# Patient Record
Sex: Female | Born: 1980 | Race: Black or African American | Hispanic: No | Smoking: Never smoker
Health system: Southern US, Community
[De-identification: ages and names within clinical notes are randomized; demographics above are authoritative.]

## PROBLEM LIST (undated history)

## (undated) DIAGNOSIS — D649 Anemia, unspecified: Secondary | ICD-10-CM

## (undated) DIAGNOSIS — D496 Neoplasm of unspecified behavior of brain: Secondary | ICD-10-CM

## (undated) HISTORY — PX: BRAIN SURGERY: SHX531

---

## 2016-05-16 ENCOUNTER — Ambulatory Visit (INDEPENDENT_AMBULATORY_CARE_PROVIDER_SITE_OTHER): Payer: 59 | Admitting: Urgent Care

## 2016-05-16 ENCOUNTER — Ambulatory Visit (INDEPENDENT_AMBULATORY_CARE_PROVIDER_SITE_OTHER): Payer: 59

## 2016-05-16 ENCOUNTER — Ambulatory Visit: Payer: 59

## 2016-05-16 VITALS — BP 118/70 | HR 74 | Temp 98.6°F | Resp 18 | Ht 66.0 in | Wt 164.8 lb

## 2016-05-16 DIAGNOSIS — R05 Cough: Secondary | ICD-10-CM

## 2016-05-16 DIAGNOSIS — R058 Other specified cough: Secondary | ICD-10-CM

## 2016-05-16 DIAGNOSIS — J029 Acute pharyngitis, unspecified: Secondary | ICD-10-CM

## 2016-05-16 MED ORDER — ACETAMINOPHEN-CODEINE 120-12 MG/5ML PO SOLN: 10.0000 mL | Freq: Three times a day (TID) | ORAL | 0 refills | Status: DC | PRN

## 2016-05-16 MED ORDER — BENZONATATE 100 MG PO CAPS: 100.0000 mg | ORAL_CAPSULE | Freq: Three times a day (TID) | ORAL | 0 refills | Status: DC | PRN

## 2016-05-16 MED ORDER — PREDNISONE 20 MG PO TABS: ORAL_TABLET | ORAL | 0 refills | Status: DC

## 2016-05-16 MED ORDER — AZITHROMYCIN 250 MG PO TABS: ORAL_TABLET | ORAL | 0 refills | Status: DC

## 2016-05-16 NOTE — Addendum Note (Signed)
Addended by: Jaynee Eagles on: 05/16/2016 04:48 PM   Modules accepted: Orders

## 2016-05-16 NOTE — Progress Notes (Signed)
  MRN: [id number] DOB: 05-03-1980  Subjective:   Melissa Cisneros is a 36 y.o. female presenting for chief complaint of Cough  Reports 3 week history of persistent productive cough, fever (highest was 104F), runny nose, sore throat worsened by her cough, fatigue. Has taken Cepacol, otc cough medications. Uses Flonase for her allergies. She has had multiple sick contacts at work. Denies chest pain, shob, n/v, abdominal pain. Denies smoking cigarettes. She updated her flu shot this past season as required by her employer.  Dorota is not currently taking any medications. Also has No Known Allergies. Lilian denies past medical and surgical history.  Objective:   Vitals: BP 118/70 (BP Location: Right Arm, Patient Position: Sitting, Cuff Size: Small)   Pulse 74   Temp 98.6 F (37 C) (Oral)   Resp 18   Ht 5\' 6"  (1.676 m)   Wt 164 lb 12.8 oz (74.8 kg)   LMP 05/16/2016 (Exact Date)   SpO2 100%   BMI 26.60 kg/m   Physical Exam  Constitutional: She is oriented to person, place, and time. She appears well-developed and well-nourished.  HENT:  Mouth/Throat: Oropharynx is clear and moist.  Eyes: No scleral icterus.  Neck: Normal range of motion. Neck supple.  Cardiovascular: Normal rate, regular rhythm and intact distal pulses.  Exam reveals no gallop and no friction rub.   No murmur heard. Pulmonary/Chest: No respiratory distress. She has no wheezes. She has no rales.  Lymphadenopathy:    She has no cervical adenopathy.  Neurological: She is alert and oriented to person, place, and time.  Skin: Skin is warm and dry.   Dg Chest 2 View  Result Date: 05/16/2016 CLINICAL DATA:  Cough EXAM: CHEST  2 VIEW COMPARISON:  None. FINDINGS: Lungs are clear. Heart size and pulmonary vascularity are normal. No adenopathy. No bone lesions. Visualized loops of bowel in the upper abdomen appear mildly dilated. IMPRESSION: No edema or consolidation.  Question a degree of bowel ileus. Electronically Signed   By: Lowella Grip III M.D.   On: 05/16/2016 13:40   Assessment and Plan :   1. Productive cough 2. Sore throat - Will cover for infectious process given duration of persistent cough. Start Azithromycin, steroid course, use cough suppression therapy. Maintain allergy medications. RTC if no improvement in symptoms.   Jaynee Eagles, PA-C [facility] [phone] 05/16/2016  1:11 PM

## 2016-05-16 NOTE — Patient Instructions (Addendum)
Cough, Adult Coughing is a reflex that clears your throat and your airways. Coughing helps to heal and protect your lungs. It is normal to cough occasionally, but a cough that happens with other symptoms or lasts a long time may be a sign of a condition that needs treatment. A cough may last only 2-3 weeks (acute), or it may last longer than 8 weeks (chronic). What are the causes? Coughing is commonly caused by:  Breathing in substances that irritate your lungs.  A viral or bacterial respiratory infection.  Allergies.  Asthma.  Postnasal drip.  Smoking.  Acid backing up from the stomach into the esophagus (gastroesophageal reflux).  Certain medicines.  Chronic lung problems, including COPD (or rarely, lung cancer).  Other medical conditions such as heart failure. Follow these instructions at home: Pay attention to any changes in your symptoms. Take these actions to help with your discomfort:  Take medicines only as told by your health care provider.  If you were prescribed an antibiotic medicine, take it as told by your health care provider. Do not stop taking the antibiotic even if you start to feel better.  Talk with your health care provider before you take a cough suppressant medicine.  Drink enough fluid to keep your urine clear or pale yellow.  If the air is dry, use a cold steam vaporizer or humidifier in your bedroom or your home to help loosen secretions.  Avoid anything that causes you to cough at work or at home.  If your cough is worse at night, try sleeping in a semi-upright position.  Avoid cigarette smoke. If you smoke, quit smoking. If you need help quitting, ask your health care provider.  Avoid caffeine.  Avoid alcohol.  Rest as needed. Contact a health care provider if:  You have new symptoms.  You cough up pus.  Your cough does not get better after 2-3 weeks, or your cough gets worse.  You cannot control your cough with suppressant medicines  and you are losing sleep.  You develop pain that is getting worse or pain that is not controlled with pain medicines.  You have a fever.  You have unexplained weight loss.  You have night sweats. Get help right away if:  You cough up blood.  You have difficulty breathing.  Your heartbeat is very fast. This information is not intended to replace advice given to you by your health care provider. Make sure you discuss any questions you have with your health care provider. Document Released: 08/30/2010 Document Revised: 08/09/2015 Document Reviewed: 05/10/2014 Elsevier Interactive Patient Education  2017 Elsevier Inc.     IF you received an x-ray today, you will receive an invoice from [facility]. Please contact [facility] at [phone] with questions or concerns regarding your invoice.   IF you received labwork today, you will receive an invoice from [facility]. Please contact [facility] at [phone] with questions or concerns regarding your invoice.   Our billing staff will not be able to assist you with questions regarding bills from these companies.  You will be contacted with the lab results as soon as they are available. The fastest way to get your results is to activate your My Chart account. Instructions are located on the last page of this paperwork. If you have not heard from us regarding the results in 2 weeks, please contact this office.      

## 2016-07-28 DIAGNOSIS — R87612 Low grade squamous intraepithelial lesion on cytologic smear of cervix (LGSIL): Secondary | ICD-10-CM | POA: Diagnosis not present

## 2016-07-28 DIAGNOSIS — Z113 Encounter for screening for infections with a predominantly sexual mode of transmission: Secondary | ICD-10-CM | POA: Diagnosis not present

## 2016-07-28 DIAGNOSIS — Z130 Encounter for screening for diseases of the blood and blood-forming organs and certain disorders involving the immune mechanism: Secondary | ICD-10-CM | POA: Diagnosis not present

## 2016-07-28 DIAGNOSIS — Z124 Encounter for screening for malignant neoplasm of cervix: Secondary | ICD-10-CM | POA: Diagnosis not present

## 2016-07-28 DIAGNOSIS — Z1389 Encounter for screening for other disorder: Secondary | ICD-10-CM | POA: Diagnosis not present

## 2016-07-28 DIAGNOSIS — Z01419 Encounter for gynecological examination (general) (routine) without abnormal findings: Secondary | ICD-10-CM | POA: Diagnosis not present

## 2016-07-28 DIAGNOSIS — Z1151 Encounter for screening for human papillomavirus (HPV): Secondary | ICD-10-CM | POA: Diagnosis not present

## 2016-08-08 DIAGNOSIS — R87612 Low grade squamous intraepithelial lesion on cytologic smear of cervix (LGSIL): Secondary | ICD-10-CM | POA: Diagnosis not present

## 2016-12-16 ENCOUNTER — Other Ambulatory Visit: Payer: Self-pay | Admitting: Family Medicine

## 2016-12-16 MED ORDER — ETONOGESTREL-ETHINYL ESTRADIOL 0.12-0.015 MG/24HR VA RING: VAGINAL_RING | VAGINAL | 4 refills | Status: DC

## 2016-12-16 MED ORDER — FLUCONAZOLE 150 MG PO TABS: 150.0000 mg | ORAL_TABLET | Freq: Once | ORAL | 1 refills | Status: AC

## 2017-02-27 DIAGNOSIS — N939 Abnormal uterine and vaginal bleeding, unspecified: Secondary | ICD-10-CM | POA: Diagnosis not present

## 2017-03-02 ENCOUNTER — Other Ambulatory Visit: Payer: Self-pay | Admitting: Family Medicine

## 2017-03-02 NOTE — Progress Notes (Signed)
error 

## 2017-03-06 ENCOUNTER — Other Ambulatory Visit: Payer: Self-pay | Admitting: Obstetrics and Gynecology

## 2017-03-06 ENCOUNTER — Other Ambulatory Visit (HOSPITAL_COMMUNITY): Payer: Self-pay | Admitting: Obstetrics and Gynecology

## 2017-03-06 ENCOUNTER — Encounter (HOSPITAL_COMMUNITY): Payer: Self-pay

## 2017-03-06 ENCOUNTER — Ambulatory Visit (HOSPITAL_COMMUNITY): Payer: 59

## 2017-03-06 ENCOUNTER — Ambulatory Visit (HOSPITAL_COMMUNITY)
Admission: AD | Admit: 2017-03-06 | Discharge: 2017-03-06 | Disposition: A | Discharge status: Home / Self Care | Payer: 59 | Source: Ambulatory Visit | Attending: Obstetrics and Gynecology | Admitting: Obstetrics and Gynecology

## 2017-03-06 ENCOUNTER — Ambulatory Visit (HOSPITAL_COMMUNITY): Payer: 59 | Admitting: Certified Registered Nurse Anesthetist

## 2017-03-06 ENCOUNTER — Encounter (HOSPITAL_COMMUNITY)
Admission: AD | Disposition: A | Discharge status: Home / Self Care | Payer: Self-pay | Source: Ambulatory Visit | Attending: Obstetrics and Gynecology

## 2017-03-06 ENCOUNTER — Ambulatory Visit (HOSPITAL_COMMUNITY)
Admission: RE | Admit: 2017-03-06 | Discharge: 2017-03-06 | Disposition: A | Discharge status: Home / Self Care | Payer: 59 | Source: Ambulatory Visit | Attending: Obstetrics and Gynecology | Admitting: Obstetrics and Gynecology

## 2017-03-06 DIAGNOSIS — O021 Missed abortion: Secondary | ICD-10-CM | POA: Diagnosis not present

## 2017-03-06 DIAGNOSIS — O046 Delayed or excessive hemorrhage following (induced) termination of pregnancy: Secondary | ICD-10-CM | POA: Diagnosis not present

## 2017-03-06 DIAGNOSIS — O074 Failed attempted termination of pregnancy without complication: Secondary | ICD-10-CM | POA: Diagnosis not present

## 2017-03-06 DIAGNOSIS — Z8759 Personal history of other complications of pregnancy, childbirth and the puerperium: Secondary | ICD-10-CM

## 2017-03-06 DIAGNOSIS — O029 Abnormal product of conception, unspecified: Secondary | ICD-10-CM | POA: Diagnosis not present

## 2017-03-06 DIAGNOSIS — O209 Hemorrhage in early pregnancy, unspecified: Secondary | ICD-10-CM | POA: Diagnosis not present

## 2017-03-06 DIAGNOSIS — Z3A00 Weeks of gestation of pregnancy not specified: Secondary | ICD-10-CM | POA: Diagnosis not present

## 2017-03-06 DIAGNOSIS — O0739 Failed attempted termination of pregnancy with other complications: Secondary | ICD-10-CM | POA: Diagnosis not present

## 2017-03-06 HISTORY — PX: DILATION AND EVACUATION: SHX1459

## 2017-03-06 HISTORY — DX: Anemia, unspecified: D64.9

## 2017-03-06 LAB — CBC WITH DIFFERENTIAL/PLATELET
Basophils Absolute: 0 10*3/uL (ref 0.0–0.1)
Basophils Relative: 0 %
Eosinophils Absolute: 0.1 10*3/uL (ref 0.0–0.7)
Eosinophils Relative: 1 %
HCT: 28.6 % — ABNORMAL LOW (ref 36.0–46.0)
Hemoglobin: 9 g/dL — ABNORMAL LOW (ref 12.0–15.0)
Lymphocytes Relative: 28 %
Lymphs Abs: 2.6 10*3/uL (ref 0.7–4.0)
MCH: 27.1 pg (ref 26.0–34.0)
MCHC: 31.5 g/dL (ref 30.0–36.0)
MCV: 86.1 fL (ref 78.0–100.0)
Monocytes Absolute: 0.3 10*3/uL (ref 0.1–1.0)
Monocytes Relative: 3 %
Neutro Abs: 6.3 10*3/uL (ref 1.7–7.7)
Neutrophils Relative %: 68 %
Platelets: 269 10*3/uL (ref 150–400)
RBC: 3.32 MIL/uL — ABNORMAL LOW (ref 3.87–5.11)
RDW: 14.1 % (ref 11.5–15.5)
WBC: 9.3 10*3/uL (ref 4.0–10.5)

## 2017-03-06 LAB — COMPREHENSIVE METABOLIC PANEL
ALT: 14 U/L (ref 14–54)
AST: 15 U/L (ref 15–41)
Albumin: 3.3 g/dL — ABNORMAL LOW (ref 3.5–5.0)
Alkaline Phosphatase: 99 U/L (ref 38–126)
Anion gap: 8 (ref 5–15)
BUN: 11 mg/dL (ref 6–20)
CO2: 24 mmol/L (ref 22–32)
Calcium: 8.8 mg/dL — ABNORMAL LOW (ref 8.9–10.3)
Chloride: 106 mmol/L (ref 101–111)
Creatinine, Ser: 0.76 mg/dL (ref 0.44–1.00)
GFR calc Af Amer: >60 mL/min (ref >60)
GFR calc non Af Amer: >60 mL/min (ref >60)
Glucose, Bld: 89 mg/dL (ref 65–99)
Potassium: 3.8 mmol/L (ref 3.5–5.1)
Sodium: 138 mmol/L (ref 135–145)
Total Bilirubin: 0.3 mg/dL (ref 0.3–1.2)
Total Protein: 6.9 g/dL (ref 6.5–8.1)

## 2017-03-06 SURGERY — DILATION AND EVACUATION, UTERUS
Anesthesia: General | Site: Vagina

## 2017-03-06 MED ORDER — KETOROLAC TROMETHAMINE 30 MG/ML IJ SOLN
INTRAMUSCULAR | Status: DC | PRN
  Administered 2017-03-06: 30 mg via INTRAVENOUS

## 2017-03-06 MED ORDER — FENTANYL CITRATE (PF) 100 MCG/2ML IJ SOLN: 25.0000 ug | INTRAMUSCULAR | Status: DC | PRN

## 2017-03-06 MED ORDER — SUCCINYLCHOLINE CHLORIDE 20 MG/ML IJ SOLN
INTRAMUSCULAR | Status: DC | PRN
  Administered 2017-03-06: 100 mg via INTRAVENOUS

## 2017-03-06 MED ORDER — CEFAZOLIN SODIUM-DEXTROSE 2-4 GM/100ML-% IV SOLN
2.0000 g | INTRAVENOUS | Status: DC
  Filled 2017-03-06: qty 100

## 2017-03-06 MED ORDER — LACTATED RINGERS IV SOLN: INTRAVENOUS | Status: DC

## 2017-03-06 MED ORDER — METOCLOPRAMIDE HCL 5 MG/ML IJ SOLN: 10.0000 mg | Freq: Once | INTRAMUSCULAR | Status: DC | PRN

## 2017-03-06 MED ORDER — FENTANYL CITRATE (PF) 100 MCG/2ML IJ SOLN
INTRAMUSCULAR | Status: AC
  Filled 2017-03-06: qty 2

## 2017-03-06 MED ORDER — DEXAMETHASONE SODIUM PHOSPHATE 10 MG/ML IJ SOLN
INTRAMUSCULAR | Status: DC | PRN
  Administered 2017-03-06: 4 mg via INTRAVENOUS

## 2017-03-06 MED ORDER — FENTANYL CITRATE (PF) 100 MCG/2ML IJ SOLN
INTRAMUSCULAR | Status: DC | PRN
  Administered 2017-03-06: 100 ug via INTRAVENOUS
  Administered 2017-03-06: 50 ug via INTRAVENOUS

## 2017-03-06 MED ORDER — ONDANSETRON HCL 4 MG/2ML IJ SOLN
INTRAMUSCULAR | Status: DC | PRN
  Administered 2017-03-06: 4 mg via INTRAVENOUS

## 2017-03-06 MED ORDER — GLYCOPYRROLATE 0.2 MG/ML IJ SOLN
INTRAMUSCULAR | Status: DC | PRN
  Administered 2017-03-06: 0.1 mg via INTRAVENOUS

## 2017-03-06 MED ORDER — LACTATED RINGERS IV SOLN
INTRAVENOUS | Status: DC | PRN
  Administered 2017-03-06: 16:00:00 via INTRAVENOUS

## 2017-03-06 MED ORDER — MIDAZOLAM HCL 2 MG/2ML IJ SOLN
INTRAMUSCULAR | Status: DC | PRN
  Administered 2017-03-06: 2 mg via INTRAVENOUS

## 2017-03-06 MED ORDER — SCOPOLAMINE 1 MG/3DAYS TD PT72
MEDICATED_PATCH | TRANSDERMAL | Status: AC
  Administered 2017-03-06: 1.5 mg via TRANSDERMAL
  Filled 2017-03-06: qty 1

## 2017-03-06 MED ORDER — CEFAZOLIN SODIUM-DEXTROSE 2-3 GM-%(50ML) IV SOLR
INTRAVENOUS | Status: DC | PRN
  Administered 2017-03-06: 2 g via INTRAVENOUS

## 2017-03-06 MED ORDER — SCOPOLAMINE 1 MG/3DAYS TD PT72
1.0000 | MEDICATED_PATCH | Freq: Once | TRANSDERMAL | Status: DC
  Administered 2017-03-06: 1.5 mg via TRANSDERMAL

## 2017-03-06 MED ORDER — MEPERIDINE HCL 25 MG/ML IJ SOLN: 6.2500 mg | INTRAMUSCULAR | Status: DC | PRN

## 2017-03-06 MED ORDER — SCOPOLAMINE 1 MG/3DAYS TD PT72
MEDICATED_PATCH | TRANSDERMAL | Status: AC
  Filled 2017-03-06: qty 1

## 2017-03-06 MED ORDER — MIDAZOLAM HCL 2 MG/2ML IJ SOLN
INTRAMUSCULAR | Status: AC
  Filled 2017-03-06: qty 2

## 2017-03-06 MED ORDER — PROPOFOL 10 MG/ML IV BOLUS
INTRAVENOUS | Status: DC | PRN
  Administered 2017-03-06: 100 mg via INTRAVENOUS
  Administered 2017-03-06: 200 mg via INTRAVENOUS

## 2017-03-06 MED ORDER — CEFAZOLIN SODIUM-DEXTROSE 2-3 GM-%(50ML) IV SOLR
INTRAVENOUS | Status: AC
  Filled 2017-03-06: qty 50

## 2017-03-06 MED ORDER — LIDOCAINE HCL (CARDIAC) 20 MG/ML IV SOLN
INTRAVENOUS | Status: DC | PRN
  Administered 2017-03-06: 100 mg via INTRAVENOUS

## 2017-03-06 SURGICAL SUPPLY — 17 items
CATH ROBINSON RED A/P 16FR (CATHETERS) ×2 IMPLANT
DECANTER SPIKE VIAL GLASS SM (MISCELLANEOUS) ×2 IMPLANT
GLOVE BIO SURGEON STRL SZ7.5 (GLOVE) ×4 IMPLANT
GLOVE BIOGEL PI IND STRL 7.0 (GLOVE) ×1 IMPLANT
GLOVE BIOGEL PI INDICATOR 7.0 (GLOVE) ×1
GOWN STRL REUS W/TWL LRG LVL3 (GOWN DISPOSABLE) ×4 IMPLANT
KIT BERKELEY 1ST TRIMESTER 3/8 (MISCELLANEOUS) ×2 IMPLANT
NS IRRIG 1000ML POUR BTL (IV SOLUTION) ×2 IMPLANT
PACK VAGINAL MINOR WOMEN LF (CUSTOM PROCEDURE TRAY) ×2 IMPLANT
PAD OB MATERNITY 4.3X12.25 (PERSONAL CARE ITEMS) ×2 IMPLANT
PAD PREP 24X48 CUFFED NSTRL (MISCELLANEOUS) ×2 IMPLANT
SET BERKELEY SUCTION TUBING (SUCTIONS) ×2 IMPLANT
TOWEL OR 17X24 6PK STRL BLUE (TOWEL DISPOSABLE) ×4 IMPLANT
VACURETTE 10 RIGID CVD (CANNULA) IMPLANT
VACURETTE 7MM CVD STRL WRAP (CANNULA) IMPLANT
VACURETTE 8 RIGID CVD (CANNULA) IMPLANT
VACURETTE 9 RIGID CVD (CANNULA) ×2 IMPLANT

## 2017-03-06 NOTE — Anesthesia Postprocedure Evaluation (Signed)
Anesthesia Post Note  Patient: [employer] forces operational officer  Procedure(s) Performed: DILATATION AND EVACUATION (N/A Vagina )     Patient location during evaluation: PACU Anesthesia Type: General Level of consciousness: awake and alert Pain management: pain level controlled Vital Signs Assessment: post-procedure vital signs reviewed and stable Respiratory status: spontaneous breathing, nonlabored ventilation, respiratory function stable and patient connected to nasal cannula oxygen Cardiovascular status: blood pressure returned to baseline and stable Postop Assessment: no apparent nausea or vomiting Anesthetic complications: no    Last Vitals:  Vitals:   03/06/17 1715 03/06/17 1738  BP:  122/77  Pulse:    Resp: 16 18  Temp:    SpO2:  100%    Last Pain:  Vitals:   03/06/17 1422  TempSrc: Oral   Pain Goal: Patients Stated Pain Goal: 4 (03/06/17 1422)               Montez Hageman

## 2017-03-06 NOTE — Op Note (Signed)
Pre and post op dx: Retained POC from medical abortion  Op: Suction D&C Anesthesia General. EBL 25 cc's. To RR in good condition  Complications:Laryngospasm with insertion of LAM Subsequent intubation

## 2017-03-06 NOTE — Transfer of Care (Signed)
Immediate Anesthesia Transfer of Care Note  Patient: Melissa Cisneros  Procedure(s) Performed: DILATATION AND EVACUATION (N/A Vagina )  Patient Location: PACU  Anesthesia Type:General  Level of Consciousness: awake, alert  and oriented  Airway & Oxygen Therapy: Patient Spontanous Breathing and Patient connected to nasal cannula oxygen  Post-op Assessment: Report given to RN and Post -op Vital signs reviewed and stable  Post vital signs: Reviewed and stable  Last Vitals:  Vitals:   03/06/17 1422 03/06/17 1616  BP: (!) 114/57 117/75  Pulse:  94  Resp: 16 16  Temp: 36.8 C 36.8 C  SpO2: 100% 100%    Last Pain:  Vitals:   03/06/17 1422  TempSrc: Oral      Patients Stated Pain Goal: 4 (76/39/43 2003)  Complications: No apparent anesthesia complications

## 2017-03-06 NOTE — Anesthesia Procedure Notes (Signed)
Procedure Name: Intubation Date/Time: 03/06/2017 3:38 PM Performed by: Genevie Ann, CRNA Pre-anesthesia Checklist: Patient identified, Emergency Drugs available, Suction available, Patient being monitored and Timeout performed Patient Re-evaluated:Patient Re-evaluated prior to induction Oxygen Delivery Method: Circle system utilized Preoxygenation: Pre-oxygenation with 100% oxygen Induction Type: IV induction Grade View: Grade I Number of attempts: 1 Placement Confirmation: ETT inserted through vocal cords under direct vision,  positive ETCO2,  CO2 detector and breath sounds checked- equal and bilateral Secured at: 21 cm Dental Injury: Teeth and Oropharynx as per pre-operative assessment

## 2017-03-06 NOTE — H&P (Signed)
NAMEARIYEL, Melissa NO.:  [id number]  MEDICAL RECORD NO.:  [id number]  LOCATION:                                 FACILITY:  PHYSICIAN:  Lucille Passy. Ulanda Edison, M.D.      DATE OF BIRTH:  DATE OF ADMISSION:  03/06/2017 DATE OF DISCHARGE:                             HISTORY & PHYSICAL   HISTORY OF PRESENT ILLNESS:  This is a 36 year old black female, admitted for D and C because of retained products of conception after a medical abortion done 2 weeks ago.  The patient had a medical abortion on February 18, 2017, and bled heavily for 7 days.  She came to my office 1 week ago today complaining of heavy bleeding that had diminished by the time she got here.  She was scheduled for an ultrasound, although scheduling the ultrasound was delayed because of no availability in our office and the hospital not being able to do it.  Finally, the ultrasound was done today, and it showed retained products of conception.  The patient has started having heavy bleeding at night again for the last 3 nights, and when offered a repeat misoprostol administration or suction D and C, she wanted to proceed with a suction D and C.  PAST MEDICAL HISTORY:  Reveals no known drug allergies.  FAMILY HISTORY:  Father with diabetes.  Mother with high blood pressure.  SOCIAL HISTORY:  The patient never smoked.  PAST SURGICAL HISTORY:  She had a C-section in October 2016.  She did have a LEEP procedure on her cervix in 2001 for an abnormal Pap smear. At that time, I saw her in the last year she had wanted to discuss a tubal ligation.  It had been offered to her at a C-section, but now she was convinced she wanted no more children.  Her children were 7, 3, and almost 2.  The patient was advised to consider all methods of birth control and to call with her desire for tubal with her menses.  I did not hear back from her, so apparently she did become pregnant and had a medical termination.  PHYSICAL  EXAMINATION:  VITAL SIGNS:  Blood pressure was 110/70, height 5 feet 6 inches, weight 171, and pulse 64. HEAD, EYES, NOSE, AND THROAT:  Normal.  The patient was somewhat overweight.  Pupils were equal and reactive to light.  Nose and pharynx clear. NECK:  Supple, without thyromegaly. HEART:  Normal size and sounds.  No murmurs. LUNGS:  Clear to auscultation. BREASTS:  Soft, without masses. ABDOMEN:  Soft and nontender. PELVIC:  Vulva and vagina were clean.  There were no lesions on the cervix.  Uterus was anterior, 7-8 weeks' size.  Adnexa free of masses. Pap smear within the last year was low-grade SIL.  She did undergo a cervical biopsy and endocervical curettage which showed low-grade squamous intraepithelial lesion in the biopsy and endocervical curette, although there was benign endocervical glandular tissue.  ADMITTING IMPRESSION:  Retained products of conception after a medical abortion.  The patient is admitted for suction D and C at her request. She is counseled about the risks of surgery including but not limited to  hemorrhage with need for hysterectomy, perforation of the uterus, injury to surrounding structures including the bladder and bowel, and delayed hemorrhage.  She understands and agrees to proceed.     Lucille Passy. Ulanda Edison, M.D.     TFH/MEDQ  D:  03/06/2017  T:  03/06/2017  Job:  [id number]

## 2017-03-06 NOTE — Anesthesia Preprocedure Evaluation (Addendum)
Anesthesia Evaluation  Patient identified by MRN, date of birth, ID band Patient awake    Reviewed: Allergy & Precautions, NPO status , Patient's Chart, lab work & pertinent test results  Airway Mallampati: II  TM Distance: >3 FB Neck ROM: Full    Dental no notable dental hx.    Pulmonary neg pulmonary ROS,    Pulmonary exam normal breath sounds clear to auscultation       Cardiovascular negative cardio ROS Normal cardiovascular exam Rhythm:Regular Rate:Normal     Neuro/Psych negative neurological ROS  negative psych ROS   GI/Hepatic negative GI ROS, Neg liver ROS,   Endo/Other  negative endocrine ROS  Renal/GU negative Renal ROS  negative genitourinary   Musculoskeletal negative musculoskeletal ROS (+)   Abdominal   Peds negative pediatric ROS (+)  Hematology negative hematology ROS (+)   Anesthesia Other Findings   Reproductive/Obstetrics negative OB ROS                             Anesthesia Physical Anesthesia Plan  ASA: II  Anesthesia Plan: General   Post-op Pain Management:    Induction: Intravenous  PONV Risk Score and Plan: 3 and Ondansetron and Dexamethasone  Airway Management Planned: LMA  Additional Equipment:   Intra-op Plan:   Post-operative Plan:   Informed Consent: I have reviewed the patients History and Physical, chart, labs and discussed the procedure including the risks, benefits and alternatives for the proposed anesthesia with the patient or authorized representative who has indicated his/her understanding and acceptance.   Dental advisory given  Plan Discussed with: CRNA  Anesthesia Plan Comments:       Anesthesia Quick Evaluation

## 2017-03-06 NOTE — Progress Notes (Signed)
Patient ID: Melissa Cisneros, female   DOB: [birth year], 36 y.o.   MRN: [id number] This lady is having no medical p[roblems except related to the failed medical abortion.

## 2017-03-06 NOTE — Discharge Instructions (Signed)
No vaginal entrance, call with temp> 100.4 degrees, with heavy vaginal bleeding or any unusual problems. Take motrin 600 mg po q6h prn pain.    DISCHARGE INSTRUCTIONS: D&C / D&E The following instructions have been prepared to help you care for yourself upon your return home.   Personal hygiene:  Use sanitary pads for vaginal drainage, not tampons.  Shower the day after your procedure.  NO tub baths, pools or Jacuzzis for 2-3 weeks.  Wipe front to back after using the bathroom.  Activity and limitations:  Do NOT drive or operate any equipment for 24 hours. The effects of anesthesia are still present and drowsiness may result.  Do NOT rest in bed all day.  Walking is encouraged.  Walk up and down stairs slowly.  You may resume your normal activity in one to two days or as indicated by your physician.  Sexual activity: NO intercourse for at least 2 weeks after the procedure, or as indicated by your physician.  Diet: Eat a light meal as desired this evening. You may resume your usual diet tomorrow.  Return to work: You may resume your work activities in one to two days or as indicated by your doctor.  What to expect after your surgery: Expect to have vaginal bleeding/discharge for 2-3 days and spotting for up to 10 days. It is not unusual to have soreness for up to 1-2 weeks. You may have a slight burning sensation when you urinate for the first day. Mild cramps may continue for a couple of days. You may have a regular period in 2-6 weeks.  Call your doctor for any of the following:  Excessive vaginal bleeding, saturating and changing one pad every hour.  Inability to urinate 6 hours after discharge from hospital.  Pain not relieved by pain medication.  Fever of 100.4 F or greater.  Unusual vaginal discharge or odor.   Call for an appointment:    Patients signature: ______________________  Nurses signature ________________________  Support person's  signature_______________________      Post Anesthesia Home Care Instructions  Activity: Get plenty of rest for the remainder of the day. A responsible individual must stay with you for 24 hours following the procedure.  For the next 24 hours, DO NOT: -Drive a car -Paediatric nurse -Drink alcoholic beverages -Take any medication unless instructed by your physician -Make any legal decisions or sign important papers.  Meals: Start with liquid foods such as gelatin or soup. Progress to regular foods as tolerated. Avoid greasy, spicy, heavy foods. If nausea and/or vomiting occur, drink only clear liquids until the nausea and/or vomiting subsides. Call your physician if vomiting continues.  Special Instructions/Symptoms: Your throat may feel dry or sore from the anesthesia or the breathing tube placed in your throat during surgery. If this causes discomfort, gargle with warm salt water. The discomfort should disappear within 24 hours.  If you had a scopolamine patch placed behind your ear for the management of post- operative nausea and/or vomiting:  1. The medication in the patch is effective for 72 hours, after which it should be removed.  Wrap patch in a tissue and discard in the trash. Wash hands thoroughly with soap and water. 2. You may remove the patch earlier than 72 hours if you experience unpleasant side effects which may include dry mouth, dizziness or visual disturbances. 3. Avoid touching the patch. Wash your hands with soap and water after contact with the patch.       NO IBUPROFEN PRODUCTS (  MOTRIN, ADVIL) OR ALEVE UNTIL 10:20PM TONIGHT.

## 2017-03-07 ENCOUNTER — Encounter (HOSPITAL_COMMUNITY): Payer: Self-pay | Admitting: Obstetrics and Gynecology

## 2017-03-11 NOTE — Op Note (Signed)
NAME:  Melissa Cisneros, Melissa Cisneros                    ACCOUNT NO.:  MEDICAL RECORD NO.:  [id number]  LOCATION:                                 FACILITY:  PHYSICIAN:  Lucille Passy. Ulanda Edison, M.D.      DATE OF BIRTH:  DATE OF PROCEDURE:  03/06/2017 DATE OF DISCHARGE:                              OPERATIVE REPORT   PREOPERATIVE DIAGNOSIS:  Retained products of conception after medical abortion.  POSTOPERATIVE DIAGNOSIS:  Retained products of conception after medical abortion.  OPERATIONS:  Suction D and C.  ASSISTANT:  None.  ANESTHESIA:  General anesthesia.  DETAILS OF PROCEDURE:  The patient was brought to the operating room and placed under satisfactory general anesthesia for laryngeal mask airway. When the airway was inserted, the patient went into laryngospasm, the doctor provided assistance and subsequently intubated her.  He felt she was oxygenated well the entire time.  The patient was in the lithotomy position.  The vulva, vagina, perineum, and urethra were prepped with Betadine solution.  Bladder was emptied with a [place] catheter, the nurse did this prep.  The area was then draped as a sterile field.  I did a time-out, identifying the patient, the procedure to be done.  The weighted speculum was placed posteriorly. Sims retractor anteriorly.  The cervix was grasped with a tenaculum on the anterior lip, uterus sounded to 8 cm anteriorly.  The cervical canal was dilated up to a 25 Pratt dilator.  The #9 curved suction curette was introduced into the uterine cavity at 8 cm, a suction D and C was done, producing fairly large amounts of placental tissue for the early stage of the pregnancy.  A sharp curette was then done to ensure that the cavity walls felt smooth.  A final circuit with the suction produced no additional tissue.  Some pressure was placed on the tenaculum site anteriorly.  There was no bleeding.  Procedure was terminated.  Blood loss no more than 25 mL.  I did inspect the  products after the procedure was terminated and found what had appeared to be the amount of products of conception I expected.  The patient returned to recovery in satisfactory condition.     Lucille Passy. Ulanda Edison, M.D.     TFH/MEDQ  D:  03/06/2017  T:  03/06/2017  Job:  [id number]

## 2017-07-24 ENCOUNTER — Other Ambulatory Visit: Payer: Self-pay | Admitting: Family Medicine

## 2017-07-24 MED ORDER — METRONIDAZOLE 500 MG PO TABS: 500.0000 mg | ORAL_TABLET | Freq: Two times a day (BID) | ORAL | 0 refills | Status: AC

## 2017-07-24 NOTE — Progress Notes (Signed)
[place] called in.

## 2017-08-11 DIAGNOSIS — Z124 Encounter for screening for malignant neoplasm of cervix: Secondary | ICD-10-CM | POA: Diagnosis not present

## 2017-08-11 DIAGNOSIS — Z3045 Encounter for surveillance of transdermal patch hormonal contraceptive device: Secondary | ICD-10-CM | POA: Diagnosis not present

## 2017-08-11 DIAGNOSIS — Z6827 Body mass index (BMI) 27.0-27.9, adult: Secondary | ICD-10-CM | POA: Diagnosis not present

## 2017-08-11 DIAGNOSIS — R631 Polydipsia: Secondary | ICD-10-CM | POA: Diagnosis not present

## 2017-08-11 DIAGNOSIS — Z1389 Encounter for screening for other disorder: Secondary | ICD-10-CM | POA: Diagnosis not present

## 2017-08-11 DIAGNOSIS — Z113 Encounter for screening for infections with a predominantly sexual mode of transmission: Secondary | ICD-10-CM | POA: Diagnosis not present

## 2017-08-11 DIAGNOSIS — Z01419 Encounter for gynecological examination (general) (routine) without abnormal findings: Secondary | ICD-10-CM | POA: Diagnosis not present

## 2017-08-12 DIAGNOSIS — Z1151 Encounter for screening for human papillomavirus (HPV): Secondary | ICD-10-CM | POA: Diagnosis not present

## 2017-08-12 DIAGNOSIS — R87612 Low grade squamous intraepithelial lesion on cytologic smear of cervix (LGSIL): Secondary | ICD-10-CM | POA: Diagnosis not present

## 2017-08-12 DIAGNOSIS — Z124 Encounter for screening for malignant neoplasm of cervix: Secondary | ICD-10-CM | POA: Diagnosis not present

## 2017-09-01 DIAGNOSIS — N879 Dysplasia of cervix uteri, unspecified: Secondary | ICD-10-CM | POA: Diagnosis not present

## 2017-09-01 DIAGNOSIS — R87612 Low grade squamous intraepithelial lesion on cytologic smear of cervix (LGSIL): Secondary | ICD-10-CM | POA: Diagnosis not present

## 2017-09-01 DIAGNOSIS — N870 Mild cervical dysplasia: Secondary | ICD-10-CM | POA: Diagnosis not present

## 2017-09-01 DIAGNOSIS — Z3202 Encounter for pregnancy test, result negative: Secondary | ICD-10-CM | POA: Diagnosis not present

## 2018-02-06 ENCOUNTER — Other Ambulatory Visit: Payer: Self-pay

## 2018-02-06 ENCOUNTER — Emergency Department (HOSPITAL_BASED_OUTPATIENT_CLINIC_OR_DEPARTMENT_OTHER)
Admission: EM | Admit: 2018-02-06 | Discharge: 2018-02-06 | Disposition: A | Discharge status: Home / Self Care | Payer: 59 | Attending: Emergency Medicine | Admitting: Emergency Medicine

## 2018-02-06 ENCOUNTER — Encounter (HOSPITAL_BASED_OUTPATIENT_CLINIC_OR_DEPARTMENT_OTHER): Payer: Self-pay | Admitting: Emergency Medicine

## 2018-02-06 DIAGNOSIS — R112 Nausea with vomiting, unspecified: Secondary | ICD-10-CM | POA: Diagnosis not present

## 2018-02-06 DIAGNOSIS — R1110 Vomiting, unspecified: Secondary | ICD-10-CM | POA: Diagnosis present

## 2018-02-06 DIAGNOSIS — R197 Diarrhea, unspecified: Secondary | ICD-10-CM | POA: Insufficient documentation

## 2018-02-06 DIAGNOSIS — E860 Dehydration: Secondary | ICD-10-CM | POA: Insufficient documentation

## 2018-02-06 LAB — URINALYSIS, ROUTINE W REFLEX MICROSCOPIC
Bilirubin Urine: NEGATIVE
Glucose, UA: NEGATIVE mg/dL
Hgb urine dipstick: NEGATIVE
Ketones, ur: 15 mg/dL — AB
Leukocytes, UA: NEGATIVE
Nitrite: NEGATIVE
Protein, ur: NEGATIVE mg/dL
Specific Gravity, Urine: >1.03 — ABNORMAL HIGH (ref 1.005–1.030)
pH: 5.5 (ref 5.0–8.0)

## 2018-02-06 LAB — PREGNANCY, URINE: Preg Test, Ur: NEGATIVE

## 2018-02-06 MED ORDER — ONDANSETRON HCL 4 MG/2ML IJ SOLN
4.0000 mg | Freq: Once | INTRAMUSCULAR | Status: AC
  Administered 2018-02-06: 4 mg via INTRAVENOUS
  Filled 2018-02-06: qty 2

## 2018-02-06 MED ORDER — SODIUM CHLORIDE 0.9 % IV BOLUS
1500.0000 mL | Freq: Once | INTRAVENOUS | Status: AC
  Administered 2018-02-06: 1500 mL via INTRAVENOUS

## 2018-02-06 MED ORDER — ONDANSETRON 8 MG PO TBDP: 8.0000 mg | ORAL_TABLET | Freq: Three times a day (TID) | ORAL | 0 refills | Status: DC | PRN

## 2018-02-06 NOTE — ED Notes (Signed)
PT tolerating POs

## 2018-02-06 NOTE — ED Triage Notes (Signed)
Pt c/o vomiting and diarrhea since last night. Her children have had similar symptoms. She states she had a syncopal episode while wiping her daughter this morning. She woke up on the floor c/o headache.

## 2018-02-06 NOTE — ED Provider Notes (Signed)
[facility] EMERGENCY DEPARTMENT Provider Note   CSN: [id number] Arrival date & time: 02/06/18  2992     History   Chief Complaint Chief Complaint  Patient presents with  . Emesis  . Diarrhea    HPI Melissa Cisneros is a 37 y.o. female.  HPI Patient is a 37 year old female presents emergency department complaints of nausea vomiting and diarrhea since last night.  No blood in her vomit or stool.  She reports her diarrhea has been watery.  She is had multiple family members with similar symptoms.  She became lightheaded while helping another child this morning and had a syncopal episode.  No preceding chest pain or palpitations.  She feels dehydrated at this time.  She feels like her urine is concentrated.  She denies focal abdominal pain.  She reports generalized abdominal cramping.  Symptoms are moderate in severity.  No other complaints.  No significant head injury.   Past Medical History:  Diagnosis Date  . Anemia     There are no active problems to display for this patient.   Past Surgical History:  Procedure Laterality Date  . CESAREAN SECTION  2016  . DILATION AND EVACUATION N/A 03/06/2017   Procedure: DILATATION AND EVACUATION;  Surgeon: Newton Pigg, MD;  Location: [facility];  Service: Gynecology;  Laterality: N/A;     OB History   None      Home Medications    Prior to Admission medications   Medication Sig Start Date End Date Taking? Authorizing Provider  fluticasone (FLONASE) 50 MCG/ACT nasal spray Place into both nostrils daily.    [provider]  ondansetron (ZOFRAN ODT) 8 MG disintegrating tablet Take 1 tablet (8 mg total) by mouth every 8 (eight) hours as needed for nausea or vomiting. 02/06/18   Jola Schmidt, MD    Family History Family History  Problem Relation Age of Onset  . Hypertension Mother   . Diabetes Father   . Diabetes Sister   . Diabetes Paternal Grandmother   . Hypertension Paternal Grandmother     Social  History Social History   Tobacco Use  . Smoking status: Never Smoker  . Smokeless tobacco: Never Used  Substance Use Topics  . Alcohol use: No  . Drug use: No     Allergies   Patient has no known allergies.   Review of Systems Review of Systems  All other systems reviewed and are negative.    Physical Exam Updated Vital Signs BP 117/73 (BP Location: Left Arm)   Pulse 83   Temp 98.6 F (37 C) (Oral)   Resp 16   Ht 5\' 6"  (1.676 m)   Wt 69.4 kg   LMP 01/20/2018   SpO2 98%   BMI 24.69 kg/m   Physical Exam  Constitutional: She is oriented to person, place, and time. She appears well-developed and well-nourished. No distress.  HENT:  Head: Normocephalic and atraumatic.  Eyes: EOM are normal.  Neck: Normal range of motion.  Cardiovascular: Normal rate and regular rhythm.  Pulmonary/Chest: Effort normal and breath sounds normal.  Abdominal: Soft. She exhibits no distension. There is no tenderness.  Musculoskeletal: Normal range of motion.  Neurological: She is alert and oriented to person, place, and time.  Skin: Skin is warm and dry.  Psychiatric: She has a normal mood and affect. Judgment normal.  Nursing note and vitals reviewed.    ED Treatments / Results  Labs (all labs ordered are listed, but only abnormal results are displayed) Labs  Reviewed  URINALYSIS, ROUTINE W REFLEX MICROSCOPIC - Abnormal; Notable for the following components:      Result Value   APPearance HAZY (*)    Specific Gravity, Urine >1.030 (*)    Ketones, ur 15 (*)    All other components within normal limits  PREGNANCY, URINE    EKG None  Radiology No results found.  Procedures Procedures (including critical care time)  Medications Ordered in ED Medications  ondansetron (ZOFRAN) injection 4 mg (4 mg Intravenous Given 02/06/18 0810)  sodium chloride 0.9 % bolus 1,500 mL (1,500 mLs Intravenous New Bag/Given 02/06/18 0805)     Initial Impression / Assessment and Plan / ED  Course  I have reviewed the triage vital signs and the nursing notes.  Pertinent labs & imaging results that were available during my care of the patient were reviewed by me and considered in my medical decision making (see chart for details).     Patient is overall well-appearing.  She feels much better after IV fluids.  Repeat abdominal exam without focal tenderness.  No indication for acute imaging.  Likely viral process.  Ongoing oral hydration at home.  Home with Zofran.  Patient and family encouraged to return to the emergency department for new or worsening symptoms  Final Clinical Impressions(s) / ED Diagnoses   Final diagnoses:  Nausea vomiting and diarrhea  Acute dehydration    ED Discharge Orders         Ordered    ondansetron (ZOFRAN ODT) 8 MG disintegrating tablet  Every 8 hours PRN     02/06/18 0908           Jola Schmidt, MD 02/06/18 [phone]

## 2018-09-10 DIAGNOSIS — Z1151 Encounter for screening for human papillomavirus (HPV): Secondary | ICD-10-CM | POA: Diagnosis not present

## 2018-09-10 DIAGNOSIS — Z01419 Encounter for gynecological examination (general) (routine) without abnormal findings: Secondary | ICD-10-CM | POA: Diagnosis not present

## 2018-09-10 DIAGNOSIS — Z3045 Encounter for surveillance of transdermal patch hormonal contraceptive device: Secondary | ICD-10-CM | POA: Diagnosis not present

## 2018-09-10 DIAGNOSIS — Z1389 Encounter for screening for other disorder: Secondary | ICD-10-CM | POA: Diagnosis not present

## 2018-09-10 DIAGNOSIS — Z124 Encounter for screening for malignant neoplasm of cervix: Secondary | ICD-10-CM | POA: Diagnosis not present

## 2018-09-10 DIAGNOSIS — Z130 Encounter for screening for diseases of the blood and blood-forming organs and certain disorders involving the immune mechanism: Secondary | ICD-10-CM | POA: Diagnosis not present

## 2018-09-10 DIAGNOSIS — Z113 Encounter for screening for infections with a predominantly sexual mode of transmission: Secondary | ICD-10-CM | POA: Diagnosis not present

## 2018-09-10 DIAGNOSIS — Z6826 Body mass index (BMI) 26.0-26.9, adult: Secondary | ICD-10-CM | POA: Diagnosis not present

## 2018-09-10 DIAGNOSIS — R87611 Atypical squamous cells cannot exclude high grade squamous intraepithelial lesion on cytologic smear of cervix (ASC-H): Secondary | ICD-10-CM | POA: Diagnosis not present

## 2018-10-26 DIAGNOSIS — R87619 Unspecified abnormal cytological findings in specimens from cervix uteri: Secondary | ICD-10-CM | POA: Diagnosis not present

## 2018-10-26 DIAGNOSIS — N870 Mild cervical dysplasia: Secondary | ICD-10-CM | POA: Diagnosis not present

## 2018-11-17 DIAGNOSIS — N871 Moderate cervical dysplasia: Secondary | ICD-10-CM | POA: Diagnosis not present

## 2018-11-17 DIAGNOSIS — Z113 Encounter for screening for infections with a predominantly sexual mode of transmission: Secondary | ICD-10-CM | POA: Diagnosis not present

## 2018-11-17 DIAGNOSIS — N870 Mild cervical dysplasia: Secondary | ICD-10-CM | POA: Diagnosis not present

## 2018-11-17 DIAGNOSIS — Z3202 Encounter for pregnancy test, result negative: Secondary | ICD-10-CM | POA: Diagnosis not present

## 2018-11-26 DIAGNOSIS — Z23 Encounter for immunization: Secondary | ICD-10-CM | POA: Diagnosis not present

## 2018-11-26 DIAGNOSIS — Z113 Encounter for screening for infections with a predominantly sexual mode of transmission: Secondary | ICD-10-CM | POA: Diagnosis not present

## 2019-05-12 DIAGNOSIS — Z23 Encounter for immunization: Secondary | ICD-10-CM | POA: Diagnosis not present

## 2019-06-23 DIAGNOSIS — Z8742 Personal history of other diseases of the female genital tract: Secondary | ICD-10-CM | POA: Diagnosis not present

## 2019-07-26 DIAGNOSIS — Z03818 Encounter for observation for suspected exposure to other biological agents ruled out: Secondary | ICD-10-CM | POA: Diagnosis not present

## 2019-07-26 DIAGNOSIS — Z20828 Contact with and (suspected) exposure to other viral communicable diseases: Secondary | ICD-10-CM | POA: Diagnosis not present

## 2020-01-28 ENCOUNTER — Other Ambulatory Visit: Payer: Self-pay

## 2020-01-28 ENCOUNTER — Emergency Department (HOSPITAL_BASED_OUTPATIENT_CLINIC_OR_DEPARTMENT_OTHER): Payer: 59

## 2020-01-28 ENCOUNTER — Emergency Department (HOSPITAL_COMMUNITY): Payer: 59

## 2020-01-28 ENCOUNTER — Encounter (HOSPITAL_BASED_OUTPATIENT_CLINIC_OR_DEPARTMENT_OTHER): Payer: Self-pay | Admitting: Emergency Medicine

## 2020-01-28 ENCOUNTER — Emergency Department (HOSPITAL_BASED_OUTPATIENT_CLINIC_OR_DEPARTMENT_OTHER)
Admission: EM | Admit: 2020-01-28 | Discharge: 2020-01-29 | Disposition: A | Discharge status: Home / Self Care | Payer: 59 | Attending: Emergency Medicine | Admitting: Emergency Medicine

## 2020-01-28 DIAGNOSIS — G44319 Acute post-traumatic headache, not intractable: Secondary | ICD-10-CM | POA: Insufficient documentation

## 2020-01-28 DIAGNOSIS — R519 Headache, unspecified: Secondary | ICD-10-CM | POA: Diagnosis present

## 2020-01-28 DIAGNOSIS — R110 Nausea: Secondary | ICD-10-CM | POA: Insufficient documentation

## 2020-01-28 DIAGNOSIS — G930 Cerebral cysts: Secondary | ICD-10-CM | POA: Insufficient documentation

## 2020-01-28 DIAGNOSIS — G9389 Other specified disorders of brain: Secondary | ICD-10-CM

## 2020-01-28 LAB — CBC WITH DIFFERENTIAL/PLATELET
Abs Immature Granulocytes: 0.04 10*3/uL (ref 0.00–0.07)
Basophils Absolute: 0.1 10*3/uL (ref 0.0–0.1)
Basophils Relative: 0 %
Eosinophils Absolute: 0.1 10*3/uL (ref 0.0–0.5)
Eosinophils Relative: 1 %
HCT: 37 % (ref 36.0–46.0)
Hemoglobin: 11.6 g/dL — ABNORMAL LOW (ref 12.0–15.0)
Immature Granulocytes: 0 %
Lymphocytes Relative: 18 %
Lymphs Abs: 2.1 10*3/uL (ref 0.7–4.0)
MCH: 28 pg (ref 26.0–34.0)
MCHC: 31.4 g/dL (ref 30.0–36.0)
MCV: 89.4 fL (ref 80.0–100.0)
Monocytes Absolute: 0.5 10*3/uL (ref 0.1–1.0)
Monocytes Relative: 4 %
Neutro Abs: 9.3 10*3/uL — ABNORMAL HIGH (ref 1.7–7.7)
Neutrophils Relative %: 77 %
Platelets: 175 10*3/uL (ref 150–400)
RBC: 4.14 MIL/uL (ref 3.87–5.11)
RDW: 14.6 % (ref 11.5–15.5)
WBC: 12.1 10*3/uL — ABNORMAL HIGH (ref 4.0–10.5)
nRBC: 0 % (ref 0.0–0.2)

## 2020-01-28 LAB — BASIC METABOLIC PANEL
Anion gap: 9 (ref 5–15)
BUN: 17 mg/dL (ref 6–20)
CO2: 23 mmol/L (ref 22–32)
Calcium: 8.7 mg/dL — ABNORMAL LOW (ref 8.9–10.3)
Chloride: 103 mmol/L (ref 98–111)
Creatinine, Ser: 0.82 mg/dL (ref 0.44–1.00)
GFR, Estimated: >60 mL/min (ref >60)
Glucose, Bld: 126 mg/dL — ABNORMAL HIGH (ref 70–99)
Potassium: 4.2 mmol/L (ref 3.5–5.1)
Sodium: 135 mmol/L (ref 135–145)

## 2020-01-28 MED ORDER — LORAZEPAM 2 MG/ML IJ SOLN
1.0000 mg | Freq: Once | INTRAMUSCULAR | Status: AC
  Administered 2020-01-29: 1 mg via INTRAVENOUS
  Filled 2020-01-28: qty 1

## 2020-01-28 MED ORDER — ONDANSETRON HCL 4 MG/2ML IJ SOLN
4.0000 mg | Freq: Once | INTRAMUSCULAR | Status: AC
  Administered 2020-01-28: 4 mg via INTRAVENOUS
  Filled 2020-01-28: qty 2

## 2020-01-28 MED ORDER — KETOROLAC TROMETHAMINE 30 MG/ML IJ SOLN
30.0000 mg | Freq: Once | INTRAMUSCULAR | Status: AC
  Administered 2020-01-28: 30 mg via INTRAVENOUS
  Filled 2020-01-28: qty 1

## 2020-01-28 NOTE — ED Provider Notes (Addendum)
Shift hand off note  Physical Exam  BP 102/60   Pulse 76   Temp 98.5 F (36.9 C) (Oral)   Resp 16   Ht 5\' 6"  (1.676 m)   Wt 78.9 kg   SpO2 100%   BMI 28.08 kg/m  Patient presents in transfer from [facility] for MRI of the brain ordered by Dr. Laverta Baltimore, per discussion with Dr. Christella Noa of neurosurgery    Physical Exam Head: NCAT, EOMI CV: RRR Lungs: CTAB Abdomen: NABS, abdomen is soft  MSK: FROM x4,   Neuro:  Alert and oriented, Patellar DTR 2+ Bilaterally   ED Course/Procedures     Results for orders placed or performed during the hospital encounter of 16/10/96  Basic metabolic panel  Result Value Ref Range   Sodium 135 135 - 145 mmol/L   Potassium 4.2 3.5 - 5.1 mmol/L   Chloride 103 98 - 111 mmol/L   CO2 23 22 - 32 mmol/L   Glucose, Bld 126 (H) 70 - 99 mg/dL   BUN 17 6 - 20 mg/dL   Creatinine, Ser 0.82 0.44 - 1.00 mg/dL   Calcium 8.7 (L) 8.9 - 10.3 mg/dL   GFR, Estimated >60 >60 mL/min   Anion gap 9 5 - 15  CBC with Differential  Result Value Ref Range   WBC 12.1 (H) 4.0 - 10.5 K/uL   RBC 4.14 3.87 - 5.11 MIL/uL   Hemoglobin 11.6 (L) 12.0 - 15.0 g/dL   HCT 37.0 36 - 46 %   MCV 89.4 80.0 - 100.0 fL   MCH 28.0 26.0 - 34.0 pg   MCHC 31.4 30.0 - 36.0 g/dL   RDW 14.6 11.5 - 15.5 %   Platelets 175 150 - 400 K/uL   nRBC 0.0 0.0 - 0.2 %   Neutrophils Relative % 77 %   Neutro Abs 9.3 (H) 1.7 - 7.7 K/uL   Lymphocytes Relative 18 %   Lymphs Abs 2.1 0.7 - 4.0 K/uL   Monocytes Relative 4 %   Monocytes Absolute 0.5 0.1 - 1.0 K/uL   Eosinophils Relative 1 %   Eosinophils Absolute 0.1 0.0 - 0.5 K/uL   Basophils Relative 0 %   Basophils Absolute 0.1 0.0 - 0.1 K/uL   Immature Granulocytes 0 %   Abs Immature Granulocytes 0.04 0.00 - 0.07 K/uL   CT Head Wo Contrast  Addendum Date: 01/28/2020   ADDENDUM REPORT: 01/28/2020 21:16 ADDENDUM: These results were called by telephone at the time of interpretation on 01/28/2020 at 9:14 pm to provider JOSHUA LONG , who verbally acknowledged  these results. Electronically Signed   By: Fidela Salisbury MD   On: 01/28/2020 21:16   Result Date: 01/28/2020 CLINICAL DATA:  Headache EXAM: CT HEAD WITHOUT CONTRAST TECHNIQUE: Contiguous axial images were obtained from the base of the skull through the vertex without intravenous contrast. COMPARISON:  None. FINDINGS: Brain: A hyperdense mass is seen within the pineal gland measuring 16 mm x 18 mm x 23 mm in greatest dimension demonstrating minimal mass effect upon the adjacent structures. A punctate focus of calcification is seen anteriorly along the margin of the mass. The mass demonstrates relative solidity without internal cystic degeneration identified. Differential considerations are led by pineal masses such as a pineal blastoma, or germ cell neoplasm such as a pineal germinoma. Given its hyperdensity, melanoma or epidermoid cysts should also be considered. No additional intracranial intra or extra-axial masses are identified. No superimposed acute intracranial hemorrhage. No evidence of acute infarct. No abnormal mass  effect or midline shift. Ventricular size is normal. Cerebellum is unremarkable. Vascular: No asymmetric hyperdense vasculature at the skull base Skull: Intact Sinuses/Orbits: Mild mucosal thickening is noted within the ethmoid air cells and sphenoid sinuses. Small layering fluid is seen within the left sphenoid sinus. The orbits are unremarkable. Other: Mastoid air cells and middle ear cavities are clear. 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 Definitive evaluation with contrast enhanced MRI examination is recommended. No associated intracranial hemorrhage or hydrocephalus. Mild paranasal sinus disease. Electronically Signed: By: Fidela Salisbury MD On: 01/28/2020 21:05   MR Brain W and Wo Contrast  Result Date: 01/29/2020 CLINICAL DATA:  Pineal mass.  Headache. EXAM: MRI  HEAD WITHOUT AND WITH CONTRAST TECHNIQUE: Multiplanar, multiecho pulse sequences of the brain and surrounding structures were obtained without and with intravenous contrast. CONTRAST:  7.25mL GADAVIST GADOBUTROL 1 MMOL/ML IV SOLN COMPARISON:  None. FINDINGS: Brain: No acute infarct, acute hemorrhage or extra-axial collection. Dorsal to the third ventricle, there is a mass of the pineal gland that measures approximately 12 x 14 mm. The mass is of intermediate T2-weighted signal and low T1-weighted signal. There is a small area of contrast enhancement at the anterior aspect of the mass. There is no hydrocephalus. No chronic microhemorrhage. Vascular: The major intracranial arterial and venous sinus flow voids are preserved. Skull and upper cervical spine: The bone marrow signal of the cranium and upper cervical vertebrae is normal. There is no skull base lesion. The visualized upper cervical spinal cord is normal. Sinuses/Orbits: Orbits are normal. Mild mucosal thickening of the sphenoid and right maxillary sinuses. Other: None IMPRESSION: 1. 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 2. No hydrocephalus. Electronically Signed   By: Ulyses Jarred M.D.   On: 01/29/2020 01:50    MDM  Patient and husband given opportunity to review images at the bedside. Patient may review all labs and imaging in MyChart, instructions for setting up my chart account on AVS.      RX for Fiorcet and zofran printed for patient    Stable for discharge with close follow up. Please call Dr. Christella Noa on Monday morning for close follow up         [employer], Laverna Dossett, MD 01/29/20 [phone]

## 2020-01-28 NOTE — ED Provider Notes (Signed)
Emergency Department Provider Note   I have reviewed the triage vital signs and the nursing notes.   HISTORY  Chief Complaint Headache   HPI Daleah Coulson is a 39 y.o. female presents to the emergency department for evaluation of acute onset headache with nausea which began this evening.  Symptoms began approximately 30 minutes prior to arrival.  She describes the pain in the top of the head which radiates to both sides.  She was not exerting herself or doing anything in particular when symptoms began.  She denies any speech change, difficulty swallowing, weakness/numbness symptoms.  She does have a remote history of migraine headaches but these have improved significantly.  She does not follow with a neurologist.  She is not having photophobia.  No fevers or chills.   Past Medical History:  Diagnosis Date  . Anemia     There are no problems to display for this patient.   Past Surgical History:  Procedure Laterality Date  . CESAREAN SECTION  2016  . DILATION AND EVACUATION N/A 03/06/2017   Procedure: DILATATION AND EVACUATION;  Surgeon: Newton Pigg, MD;  Location: [facility];  Service: Gynecology;  Laterality: N/A;    Allergies Patient has no known allergies.  Family History  Problem Relation Age of Onset  . Hypertension Mother   . Diabetes Father   . Diabetes Sister   . Diabetes Paternal Grandmother   . Hypertension Paternal Grandmother     Social History Social History   Tobacco Use  . Smoking status: Never Smoker  . Smokeless tobacco: Never Used  Substance Use Topics  . Alcohol use: No  . Drug use: No    Review of Systems  Constitutional: No fever/chills Eyes: No visual changes. ENT: No sore throat. Cardiovascular: Denies chest pain. Respiratory: Denies shortness of breath. Gastrointestinal: No abdominal pain. Positive nausea, no vomiting.  No diarrhea.  No constipation. Genitourinary: Negative for dysuria. Musculoskeletal: Negative for back  pain. Skin: Negative for rash. Neurological: Negative for focal weakness or numbness. Positive HA.   10-point ROS otherwise negative.  ____________________________________________   PHYSICAL EXAM:  VITAL SIGNS: ED Triage Vitals  Enc Vitals Group     BP 01/28/20 2024 125/77     Pulse Rate 01/28/20 2024 64     Resp 01/28/20 2024 18     Temp 01/28/20 2024 98.4 F (36.9 C)     Temp Source 01/28/20 2024 Oral     SpO2 01/28/20 2024 100 %     Weight 01/28/20 2025 174 lb (78.9 kg)     Height 01/28/20 2025 5\' 6"  (1.676 m)   Constitutional: Alert and oriented. Well appearing and in no acute distress. Eyes: Conjunctivae are normal. PERRL.  Head: Atraumatic. Nose: No congestion/rhinnorhea. Mouth/Throat: Mucous membranes are moist.  Neck: No stridor.  Cardiovascular: Good peripheral circulation.   Respiratory: Normal respiratory effort.  Gastrointestinal: No distention.  Musculoskeletal: No gross deformities of extremities. Neurologic:  Normal speech and language. No gross focal neurologic deficits are appreciated. No facial asymmetry. 5/5 strength in the upper and lower extremities. No numbness.  Skin:  Skin is warm, dry and intact. No rash noted.  ____________________________________________   LABS (all labs ordered are listed, but only abnormal results are displayed)  Labs Reviewed  BASIC METABOLIC PANEL - Abnormal; Notable for the following components:      Result Value   Glucose, Bld 126 (*)    Calcium 8.7 (*)    All other components within normal limits  CBC WITH  DIFFERENTIAL/PLATELET - Abnormal; Notable for the following components:   WBC 12.1 (*)    Hemoglobin 11.6 (*)    Neutro Abs 9.3 (*)    All other components within normal limits  PREGNANCY, URINE    ____________________________________________  RADIOLOGY  CT Head Wo Contrast  Addendum Date: 01/28/2020   ADDENDUM REPORT: 01/28/2020 21:16 ADDENDUM: These results were called by telephone at the time of  interpretation on 01/28/2020 at 9:14 pm to provider Lilyanne Mcquown , who verbally acknowledged these results. Electronically Signed   By: Fidela Salisbury MD   On: 01/28/2020 21:16   Result Date: 01/28/2020 CLINICAL DATA:  Headache EXAM: CT HEAD WITHOUT CONTRAST TECHNIQUE: Contiguous axial images were obtained from the base of the skull through the vertex without intravenous contrast. COMPARISON:  None. FINDINGS: Brain: A hyperdense mass is seen within the pineal gland measuring 16 mm x 18 mm x 23 mm in greatest dimension demonstrating minimal mass effect upon the adjacent structures. A punctate focus of calcification is seen anteriorly along the margin of the mass. The mass demonstrates relative solidity without internal cystic degeneration identified. Differential considerations are led by pineal masses such as a pineal blastoma, or germ cell neoplasm such as a pineal germinoma. Given its hyperdensity, melanoma or epidermoid cysts should also be considered. No additional intracranial intra or extra-axial masses are identified. No superimposed acute intracranial hemorrhage. No evidence of acute infarct. No abnormal mass effect or midline shift. Ventricular size is normal. Cerebellum is unremarkable. Vascular: No asymmetric hyperdense vasculature at the skull base Skull: Intact Sinuses/Orbits: Mild mucosal thickening is noted within the ethmoid air cells and sphenoid sinuses. Small layering fluid is seen within the left sphenoid sinus. The orbits are unremarkable. Other: Mastoid air cells and middle ear cavities are clear. 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 Definitive evaluation with contrast enhanced MRI examination is recommended. No associated intracranial hemorrhage or hydrocephalus. Mild paranasal sinus disease. Electronically Signed: By: Fidela Salisbury MD On: 01/28/2020 21:05     ____________________________________________   PROCEDURES  Procedure(s) performed:   Procedures  None  ____________________________________________   INITIAL IMPRESSION / ASSESSMENT AND PLAN / ED COURSE  Pertinent labs & imaging results that were available during my care of the patient were reviewed by me and considered in my medical decision making (see chart for details).   Patient presents to the emergency department with acute onset headache with nausea.  She has no focal neurologic deficits.  Headache is atypical for her and that she is here within 30 minutes of headache started.  Noncontrast CT scan of the head ordered to evaluate for possible SAH but overall clinical suspicion is low.  I was called the radiology discussed the CT findings.  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  Discussed these findings with Dr. Christella Noa on-call for neurosurgery through [facility]. Agrees with plan for MRI brain today w/ and w/o contrast. He can f/u with patient in the office early next week but with no hydrocephalus there is no intervention required tonight. Patient to call the office on Monday AM.   Discussed the CT findings with the patient and her husband at bedside.  The patient will arrive POV to the [facility] ED for MRI tonight. Dr. Sedonia Small is accepting.   ____________________________________________  FINAL CLINICAL IMPRESSION(S) / ED DIAGNOSES  Final diagnoses:  Acute nonintractable headache, unspecified headache type  Brain mass  MEDICATIONS GIVEN DURING THIS VISIT:  Medications  ketorolac (TORADOL) 30 MG/ML injection 30 mg (30 mg Intravenous Given 01/28/20 2158)  ondansetron (ZOFRAN) injection 4 mg (4 mg Intravenous Given 01/28/20 2157)    Note:  This document was prepared using Dragon voice recognition software and may include unintentional dictation errors.  Nanda Quinton, MD, [facility] Emergency Medicine    Lenisha Lacap, [facility], MD 01/28/20  2240

## 2020-01-28 NOTE — ED Notes (Signed)
Patient arrived from [facility] sent here for MRI of brain , patient denies headache , nausea relieved .

## 2020-01-28 NOTE — ED Triage Notes (Signed)
Reports headache that started about 30 min pta.  Took ibuprofen 800mg  pta.  Also endorses nausea.

## 2020-01-29 ENCOUNTER — Emergency Department (HOSPITAL_COMMUNITY): Payer: 59

## 2020-01-29 MED ORDER — ONDANSETRON 8 MG PO TBDP: ORAL_TABLET | ORAL | 0 refills | Status: DC

## 2020-01-29 MED ORDER — BUTALBITAL-APAP-CAFFEINE 50-325-40 MG PO TABS: 1.0000 | ORAL_TABLET | Freq: Four times a day (QID) | ORAL | 0 refills | Status: DC | PRN

## 2020-01-29 MED ORDER — GADOBUTROL 1 MMOL/ML IV SOLN
7.5000 mL | Freq: Once | INTRAVENOUS | Status: AC | PRN
  Administered 2020-01-29: 7.5 mL via INTRAVENOUS

## 2020-03-06 ENCOUNTER — Other Ambulatory Visit: Payer: Self-pay | Admitting: Neurosurgery

## 2020-03-06 DIAGNOSIS — D354 Benign neoplasm of pineal gland: Secondary | ICD-10-CM

## 2020-03-08 ENCOUNTER — Emergency Department (HOSPITAL_COMMUNITY)
Admission: EM | Admit: 2020-03-08 | Discharge: 2020-03-08 | Disposition: A | Discharge status: Home / Self Care | Payer: 59 | Attending: Emergency Medicine | Admitting: Emergency Medicine

## 2020-03-08 ENCOUNTER — Encounter (HOSPITAL_COMMUNITY): Payer: Self-pay

## 2020-03-08 ENCOUNTER — Emergency Department (HOSPITAL_COMMUNITY): Payer: 59

## 2020-03-08 ENCOUNTER — Other Ambulatory Visit: Payer: Self-pay

## 2020-03-08 DIAGNOSIS — R55 Syncope and collapse: Secondary | ICD-10-CM | POA: Insufficient documentation

## 2020-03-08 DIAGNOSIS — R519 Headache, unspecified: Secondary | ICD-10-CM | POA: Diagnosis not present

## 2020-03-08 DIAGNOSIS — R42 Dizziness and giddiness: Secondary | ICD-10-CM | POA: Insufficient documentation

## 2020-03-08 DIAGNOSIS — Z7982 Long term (current) use of aspirin: Secondary | ICD-10-CM | POA: Diagnosis not present

## 2020-03-08 DIAGNOSIS — D354 Benign neoplasm of pineal gland: Secondary | ICD-10-CM | POA: Diagnosis not present

## 2020-03-08 DIAGNOSIS — G9389 Other specified disorders of brain: Secondary | ICD-10-CM

## 2020-03-08 LAB — DIFFERENTIAL
Abs Immature Granulocytes: 0.05 10*3/uL (ref 0.00–0.07)
Basophils Absolute: 0 10*3/uL (ref 0.0–0.1)
Basophils Relative: 0 %
Eosinophils Absolute: 0.1 10*3/uL (ref 0.0–0.5)
Eosinophils Relative: 1 %
Immature Granulocytes: 1 %
Lymphocytes Relative: 15 %
Lymphs Abs: 1.6 10*3/uL (ref 0.7–4.0)
Monocytes Absolute: 0.4 10*3/uL (ref 0.1–1.0)
Monocytes Relative: 4 %
Neutro Abs: 8.3 10*3/uL — ABNORMAL HIGH (ref 1.7–7.7)
Neutrophils Relative %: 79 %

## 2020-03-08 LAB — COMPREHENSIVE METABOLIC PANEL
ALT: 13 U/L (ref 0–44)
AST: 18 U/L (ref 15–41)
Albumin: 3.2 g/dL — ABNORMAL LOW (ref 3.5–5.0)
Alkaline Phosphatase: 92 U/L (ref 38–126)
Anion gap: 10 (ref 5–15)
BUN: 10 mg/dL (ref 6–20)
CO2: 24 mmol/L (ref 22–32)
Calcium: 9.1 mg/dL (ref 8.9–10.3)
Chloride: 98 mmol/L (ref 98–111)
Creatinine, Ser: 0.81 mg/dL (ref 0.44–1.00)
GFR, Estimated: >60 mL/min (ref >60)
Glucose, Bld: 138 mg/dL — ABNORMAL HIGH (ref 70–99)
Potassium: 4 mmol/L (ref 3.5–5.1)
Sodium: 132 mmol/L — ABNORMAL LOW (ref 135–145)
Total Bilirubin: 0.6 mg/dL (ref 0.3–1.2)
Total Protein: 6.4 g/dL — ABNORMAL LOW (ref 6.5–8.1)

## 2020-03-08 LAB — CBC
HCT: 35.5 % — ABNORMAL LOW (ref 36.0–46.0)
Hemoglobin: 11.4 g/dL — ABNORMAL LOW (ref 12.0–15.0)
MCH: 28.5 pg (ref 26.0–34.0)
MCHC: 32.1 g/dL (ref 30.0–36.0)
MCV: 88.8 fL (ref 80.0–100.0)
Platelets: 173 10*3/uL (ref 150–400)
RBC: 4 MIL/uL (ref 3.87–5.11)
RDW: 14 % (ref 11.5–15.5)
WBC: 10.4 10*3/uL (ref 4.0–10.5)
nRBC: 0 % (ref 0.0–0.2)

## 2020-03-08 LAB — PROTIME-INR
INR: 1 (ref 0.8–1.2)
Prothrombin Time: 12.8 seconds (ref 11.4–15.2)

## 2020-03-08 LAB — APTT: aPTT: 25 seconds (ref 24–36)

## 2020-03-08 LAB — I-STAT BETA HCG BLOOD, ED (MC, WL, AP ONLY): I-stat hCG, quantitative: <5 m[IU]/mL (ref <5)

## 2020-03-08 MED ORDER — KETOROLAC TROMETHAMINE 30 MG/ML IJ SOLN
30.0000 mg | Freq: Once | INTRAMUSCULAR | Status: AC
  Administered 2020-03-08: 30 mg via INTRAVENOUS
  Filled 2020-03-08: qty 1

## 2020-03-08 MED ORDER — MORPHINE SULFATE (PF) 4 MG/ML IV SOLN
4.0000 mg | Freq: Once | INTRAVENOUS | Status: AC
Start: 2020-03-08 — End: 2020-03-08
  Administered 2020-03-08: 4 mg via INTRAVENOUS
  Filled 2020-03-08: qty 1

## 2020-03-08 MED ORDER — BUTALBITAL-APAP-CAFFEINE 50-325-40 MG PO TABS: 1.0000 | ORAL_TABLET | Freq: Four times a day (QID) | ORAL | 0 refills | Status: DC | PRN

## 2020-03-08 MED ORDER — ONDANSETRON HCL 4 MG/2ML IJ SOLN
4.0000 mg | Freq: Once | INTRAMUSCULAR | Status: AC
  Administered 2020-03-08: 4 mg via INTRAVENOUS
  Filled 2020-03-08: qty 2

## 2020-03-08 MED ORDER — ONDANSETRON 4 MG PO TBDP: 4.0000 mg | ORAL_TABLET | Freq: Three times a day (TID) | ORAL | 0 refills | Status: AC | PRN

## 2020-03-08 MED ORDER — SODIUM CHLORIDE 0.9% FLUSH
3.0000 mL | Freq: Once | INTRAVENOUS | Status: AC
  Administered 2020-03-08: 3 mL via INTRAVENOUS

## 2020-03-08 NOTE — ED Notes (Signed)
Patient discharged, vetrbalized understanding of DC instructions.  ambulated to WR   0/ 10 pain  PIV removed  

## 2020-03-08 NOTE — ED Triage Notes (Signed)
Pt reports severe headache that started yesterday with 2 syncopal episodes since then. Pt also reports nystagmus and difficulty with her balance. 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 Arneta Cliche for. Pt a.o at this time. Pt currently feeling dizzy and nauseated. Neuro exam normal in triage.

## 2020-03-08 NOTE — ED Provider Notes (Signed)
[facility] Provider Note  CSN: [id number] Arrival date & time: 03/08/20 0923    History Chief Complaint  Patient presents with  . Headache  . Loss of Consciousness  . Dizziness    HPI  Melissa Cisneros is a 39 y.o. 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 During the night she got up to go to the bathroom and had onset of room-spinning dizziness, nystagmus and then syncope. She states she woke up on the floor. She has history of menstrual migraines but different character from this headache. She was told by Neurosurgery that her headaches were not caused by her pineal gland tumor. Her dizziness/nystagmus only happens when her headaches become severe and improved when headache subsides. She is currently feeling better although still some nausea.    Past Medical History:  Diagnosis Date  . Anemia     Past Surgical History:  Procedure Laterality Date  . CESAREAN SECTION  2016  . DILATION AND EVACUATION N/A 03/06/2017   Procedure: DILATATION AND EVACUATION;  Surgeon: Tracey Harries, MD;  Location: [facility];  Service: Gynecology;  Laterality: N/A;    Family History  Problem Relation Age of Onset  . Hypertension Mother   . Diabetes Father   . Diabetes Sister   . Diabetes Paternal Grandmother   . Hypertension Paternal Grandmother     Social History   Tobacco Use  . Smoking status: Never Smoker  . Smokeless tobacco: Never Used  Substance Use Topics  . Alcohol use: No  . Drug use: No     Home Medications Prior to Admission medications   Medication Sig Start Date End Date Taking? Authorizing Provider  aspirin-acetaminophen-caffeine (EXCEDRIN MIGRAINE) [id number] [id number] MG tablet Take 1 tablet by mouth every 6 (six) hours as needed for headache.   Yes [provider]  ibuprofen (ADVIL) 800 MG  tablet Take 800 mg by mouth every 8 (eight) hours. 01/03/15  Yes [provider]  Burr Medico 150-35 MCG/24HR transdermal patch Place 1 patch onto the skin once a week. 02/24/20  Yes [provider]  butalbital-acetaminophen-caffeine (FIORICET) 50-325-40 MG tablet Take 1-2 tablets by mouth every 6 (six) hours as needed for headache. 03/08/20 03/08/21  Pollyann Savoy, MD  ondansetron (ZOFRAN ODT) 4 MG disintegrating tablet Take 1 tablet (4 mg total) by mouth every 8 (eight) hours as needed for nausea or vomiting. 03/08/20   Pollyann Savoy, MD     Allergies    Flexeril [cyclobenzaprine] and Gabapentin   Review of Systems   Review of Systems A comprehensive review of systems was completed and negative except as noted in HPI.    Physical Exam BP 115/88   Pulse 65   Temp 98.5 F (36.9 C) (Oral)   Resp 18   Ht 5\' 6"  (1.676 m)   Wt 77.1 kg   SpO2 100%   BMI 27.44 kg/m   Physical Exam Vitals and nursing note reviewed.  Constitutional:      Appearance: Normal appearance.  HENT:     Head: Normocephalic and atraumatic.     Nose: Nose normal.     Mouth/Throat:     Mouth: Mucous membranes are moist.  Eyes:     Extraocular Movements: Extraocular movements intact.     Conjunctiva/sclera: Conjunctivae normal.  Cardiovascular:     Rate and Rhythm: Normal rate.  Pulmonary:  Effort: Pulmonary effort is normal.     Breath sounds: Normal breath sounds.  Abdominal:     General: Abdomen is flat.     Palpations: Abdomen is soft.     Tenderness: There is no abdominal tenderness.  Musculoskeletal:        General: No swelling. Normal range of motion.     Cervical back: Neck supple.  Skin:    General: Skin is warm and dry.  Neurological:     General: No focal deficit present.     Mental Status: She is alert and oriented to person, place, and time.     Cranial Nerves: No cranial nerve deficit.     Sensory: No sensory deficit.     Motor: No weakness.      Coordination: Coordination normal.     Gait: Gait normal.     Comments: No nystagmus  Psychiatric:        Mood and Affect: Mood normal.      ED Results / Procedures / Treatments   Labs (all labs ordered are listed, but only abnormal results are displayed) Labs Reviewed  CBC - Abnormal; Notable for the following components:      Result Value   Hemoglobin 11.4 (*)    HCT 35.5 (*)    All other components within normal limits  DIFFERENTIAL - Abnormal; Notable for the following components:   Neutro Abs 8.3 (*)    All other components within normal limits  COMPREHENSIVE METABOLIC PANEL - Abnormal; Notable for the following components:   Sodium 132 (*)    Glucose, Bld 138 (*)    Total Protein 6.4 (*)    Albumin 3.2 (*)    All other components within normal limits  PROTIME-INR  APTT  CBG MONITORING, ED  I-STAT BETA HCG BLOOD, ED ([facility], [facility], [facility] ONLY)    EKG EKG Interpretation  Date/Time:  Thursday March 08 2020 08:42:20 EST Ventricular Rate:  59 PR Interval:  114 QRS Duration: 76 QT Interval:  386 QTC Calculation: 382 R Axis:   64 Text Interpretation: Sinus bradycardia Otherwise normal ECG No old tracing to compare Confirmed by Calvert Cantor [id number]) on 03/08/2020 9:08:52 AM   Radiology CT HEAD WO CONTRAST  Result Date: 03/08/2020 CLINICAL DATA:  Headache and dizziness.  Pineal mass. EXAM: CT HEAD WITHOUT CONTRAST TECHNIQUE: Contiguous axial images were obtained from the base of the skull through the vertex without intravenous contrast. COMPARISON:  MRI 01/29/2020.  CT 01/28/2020. FINDINGS: Brain: Redemonstrated is a hyperdense mass of the pineal gland measuring by CT 2.3 x 1.8 by 2.2 cm. Measurements previously were 2.3 x 1.7 x 1.9 cm. Hyperdense nature redemonstrated. Tiny calcification along the anterior margin. There is slight but definite increase in size of the lateral and third ventricles, measuring 1-2 mm larger than on the previous study. One could question sulcal  effacement, but this may be technical. No focal brain parenchymal finding. No extra-axial fluid collection. Vascular: No abnormal vascular finding. Skull: Normal Sinuses/Orbits: Clear/normal Other: None IMPRESSION: Redemonstrated is a hyperdense mass of the pineal gland measuring 2.3 x 1.8 x 2.2 cm. Measurements previously were 2.3 x 1.7 x 1.9 cm, indicating slight enlargement. Slight but definite increase in size of the lateral and third ventricles, measuring 1-2 mm larger than on the previous study. Study discussed with Dr. Karle Starch at the time of interpretation. Electronically Signed   By: Nelson Chimes M.D.   On: 03/08/2020 10:32    Procedures Procedures  Medications Ordered in the  ED Medications  sodium chloride flush (NS) 0.9 % injection 3 mL (3 mLs Intravenous Given 03/08/20 1057)  ondansetron (ZOFRAN) injection 4 mg (4 mg Intravenous Given 03/08/20 1051)  morphine 4 MG/ML injection 4 mg (4 mg Intravenous Given 03/08/20 1051)  ketorolac (TORADOL) 30 MG/ML injection 30 mg (30 mg Intravenous Given 03/08/20 1104)     MDM Rules/Calculators/A&P MDM EKG with mild bradycardia, otherwise no signs of significant dysrhythmia. Will check CT head for signs of complications of known pineal glad tumor including hydrocephalus. Basic labs ordered. Patient requesting Zofran for nausea.  ED Course  I have reviewed the triage vital signs and the nursing notes.  Pertinent labs & imaging results that were available during my care of the patient were reviewed by me and considered in my medical decision making (see chart for details).  Clinical Course as of 03/08/20 1212  Thu Mar 08, 2020  0942 CBC with mild anemia, similar to previous.  [CS]  78 CMP is unremarkable, mild hyponatremia and low protein of unclear significance.  [CS]  3154 Reviewed CT images with radiologist, mild increase in size of pineal mass and ventricles raising question of increased ICP. Will discuss with Neurosurgery.  Patient  reports headache has returned, also having a flare of her sciatica. Will give Morphine for comfort pending Neurosurg consult. NPO for now.  [CS]  84 Spoke with Dr. Christella Noa, who has reviewed CT findings and does recommend any additional ED workup, does not feel that her headaches are in any way caused by her pineal mass and that he will follow up with her in his clinic. He agrees with plan for Neurology referral as well for evaluation of headaches.  [CS]  1206 Patient reports improvement in pain. Plan discharge home, follow up with Neurosurgery as scheduled. Referral to Neurology. She did not get Rx for Fioricet filled at last visit and would like a new Rx to take as needed. RTED for any other concerns. Patient is amenable to this plan.  [CS]    Clinical Course User Index [CS] Truddie Hidden, MD    Final Clinical Impression(s) / ED Diagnoses Final diagnoses:  Acute nonintractable headache, unspecified headache type  Mass of pineal region    Rx / DC Orders ED Discharge Orders         Ordered    butalbital-acetaminophen-caffeine (FIORICET) 50-325-40 MG tablet  Every 6 hours PRN,   Status:  Discontinued        03/08/20 1211    ondansetron (ZOFRAN ODT) 4 MG disintegrating tablet  Every 8 hours PRN        03/08/20 1211    butalbital-acetaminophen-caffeine (FIORICET) 50-325-40 MG tablet  Every 6 hours PRN        03/08/20 1212           Truddie Hidden, MD 03/08/20 1212

## 2020-03-08 NOTE — ED Notes (Signed)
FIRST ENCOUNTER:  Patient resting in bed no issues noted at this time  VSS

## 2020-03-19 ENCOUNTER — Telehealth: Payer: Self-pay | Admitting: Neurology

## 2020-03-19 ENCOUNTER — Ambulatory Visit: Payer: 59 | Admitting: Neurology

## 2020-03-19 ENCOUNTER — Encounter: Payer: Self-pay | Admitting: Neurology

## 2020-03-19 NOTE — Telephone Encounter (Signed)
This patient did not show for new patient appointment today.

## 2020-03-21 ENCOUNTER — Encounter: Payer: Self-pay | Admitting: Neurology

## 2020-03-21 ENCOUNTER — Ambulatory Visit: Payer: 59 | Admitting: Neurology

## 2020-03-21 ENCOUNTER — Encounter: Payer: Self-pay | Admitting: *Deleted

## 2020-03-21 ENCOUNTER — Telehealth: Payer: Self-pay | Admitting: Neurology

## 2020-03-21 ENCOUNTER — Other Ambulatory Visit: Payer: Self-pay

## 2020-03-21 VITALS — BP 129/83 | HR 75 | Ht 66.0 in | Wt 171.0 lb

## 2020-03-21 DIAGNOSIS — D497 Neoplasm of unspecified behavior of endocrine glands and other parts of nervous system: Secondary | ICD-10-CM

## 2020-03-21 DIAGNOSIS — R4020 Unspecified coma: Secondary | ICD-10-CM

## 2020-03-21 DIAGNOSIS — G911 Obstructive hydrocephalus: Secondary | ICD-10-CM

## 2020-03-21 DIAGNOSIS — G4489 Other headache syndrome: Secondary | ICD-10-CM

## 2020-03-21 DIAGNOSIS — H4710 Unspecified papilledema: Secondary | ICD-10-CM

## 2020-03-21 DIAGNOSIS — H4922 Sixth [abducent] nerve palsy, left eye: Secondary | ICD-10-CM | POA: Insufficient documentation

## 2020-03-21 MED ORDER — ACETAZOLAMIDE 250 MG PO TABS: 250.0000 mg | ORAL_TABLET | Freq: Two times a day (BID) | ORAL | 6 refills | Status: AC

## 2020-03-21 NOTE — Patient Instructions (Addendum)
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 The fluid that surrounds the brain and spinal cord (cerebrospinal fluid, CSF) increases and causes the pressure. Idiopathic means that the cause of this condition is not known. IIH affects the brain and spinal cord (is a neurological disorder). If this condition is not treated, it can cause vision loss or blindness. What increases the risk? You are more likely to develop this condition if:  You are severely overweight (obese).  You are a woman who has not gone through menopause.  You take certain medicines, such as birth control or steroids. What are the signs or symptoms? Symptoms of IIH include:  Headaches. This is the most common symptom.  Pain in the shoulders or neck.  Nausea and vomiting.  A "rushing water" or pulsing sound within the ears (pulsatile tinnitus).  Double vision.  Blurred vision.  Brief episodes of complete vision loss. How is this diagnosed? This condition may be diagnosed based on:  Your symptoms.  Your medical history.  CT scan of the brain.  MRI of the brain.  Magnetic resonance venogram (MRV) to check veins in the brain.  Diagnostic lumbar puncture. This is a procedure to remove and examine a sample of cerebrospinal fluid. This procedure can determine whether too much fluid may be causing IIH.  A thorough eye exam to check for swelling or nerve damage in the eyes. How is this treated? Treatment for this condition depends on your symptoms. The goal of treatment is to decrease the pressure around your brain. Common treatments include:  Medicines to decrease the production of spinal fluid and lower the pressure within your skull.  Medicines to prevent or treat headaches.  Surgery to place drains (shunts) in your brain to remove excess  fluid.  Lumbar puncture to remove excess cerebrospinal fluid. Follow these instructions at home:  If you are overweight or obese, work with your health care provider to lose weight.  Take over-the-counter and prescription medicines only as told by your health care provider.  Do not drive or use heavy machinery while taking medicines that can make you sleepy.  Keep all follow-up visits as told by your health care provider. This is important. Contact a health care provider if:  You have changes in your vision, such as: ? Double vision. ? Not being able to see colors (color vision). Get help right away if:  You have any of the following symptoms and they get worse or do not get better. ? Headaches. ? Nausea. ? Vomiting. ? Vision changes or difficulty seeing. Summary  Idiopathic intracranial hypertension (IIH) is a condition that increases pressure around the brain. The cause is not known (is idiopathic).  The most common symptom of IIH is headaches.  Treatment may include medicines or surgery to relieve the pressure on your brain. This information is not intended to replace advice given to you by your health care provider. Make sure you discuss any questions you have with your health care provider. Document Revised: 02/13/2017 Document Reviewed: 01/23/2016 Elsevier Patient Education  2020 [employer]  Acetazolamide Oral Tablets What is this medicine? ACETAZOLAMIDE (a set a ZOLE a mide) is a diuretic. It helps you make more urine and to lose salt and excess water from your body. It treats swelling from heart disease. It helps treat some seizures and some kinds of glaucoma. It also treats and prevents  symptoms of altitude sickness (acute mountain sickness). This medicine may be used for other purposes; ask your health care provider or pharmacist if you have questions. COMMON BRAND NAME(S): Diamox What should I tell my health care provider before I take this medicine? They need  to know if you have any of these conditions:  glaucoma  kidney disease  liver disease  low adrenal gland function  lung or breathing disease (COPD, chronic bronchitis, emphysema)  an unusual or allergic reaction to acetazolamide, sulfa drugs, other drugs, foods, dyes or preservatives  pregnant or trying to get pregnant  breast-feeding How should I use this medicine? Take this medicine by mouth with a glass of water. Follow the directions on the prescription label. Take this medicine with food if it upsets your stomach. Take your doses at regular intervals. Do not take your medicine more often than directed. Do not stop taking except on your doctor's advice. Talk to your pediatrician regarding the use of this medicine in children. Special care may be needed. Patients over 17 years old may have a stronger reaction and need a smaller dose. Overdosage: If you think you have taken too much of this medicine contact a poison control center or emergency room at once. NOTE: This medicine is only for you. Do not share this medicine with others. What if I miss a dose? If you miss a dose, take it as soon as you can. If it is almost time for your next dose, take only that dose. Do not take double or extra doses. What may interact with this medicine? Do not take this medicine with any of the following medications:  methazolamide This medicine may also interact with the following medications:  aspirin and aspirin-like medicines  cyclosporine  lithium  medicine for diabetes  methenamine  other diuretics  phenytoin  primidone  quinidine  sodium bicarbonate  stimulant medicines like dextroamphetamine This list may not describe all possible interactions. Give your health care provider a list of all the medicines, herbs, non-prescription drugs, or dietary supplements you use. Also tell them if you smoke, drink alcohol, or use illegal drugs. Some items may interact with your  medicine. What should I watch for while using this medicine? Visit your doctor or health care professional for regular checks on your progress. You will need blood work done regularly. If you are diabetic, check your blood sugar as directed. You may need to be on a special diet while taking this medicine. Ask your doctor. Also, ask how many glasses of fluid you need to drink a day. You must not get dehydrated. You may get drowsy or dizzy. Do not drive, use machinery, or do anything that needs mental alertness until you know how this medicine affects you. Do not stand or sit up quickly, especially if you are an older patient. This reduces the risk of dizzy or fainting spells. This medicine can make you more sensitive to the sun. Keep out of the sun. If you cannot avoid being in the sun, wear protective clothing and use sunscreen. Do not use sun lamps or tanning beds/booths. What side effects may I notice from receiving this medicine? Side effects that you should report to your doctor or health care professional as soon as possible:  allergic reactions like skin rash, itching or hives, swelling of the face, lips, or tongue  breathing problems  confusion, depression  dark urine  fever  numbness, tingling in hands or feet  redness, blistering, peeling or loosening of the  skin, including inside the mouth  ringing in the ears  seizures  unusually weak or tired  yellowing of the eyes or skin Side effects that usually do not require medical attention (report to your doctor or health care professional if they continue or are bothersome):  change in taste  diarrhea  headache  loss of appetite  nausea, vomiting  passing urine more often This list may not describe all possible side effects. Call your doctor for medical advice about side effects. You may report side effects to FDA at 1-800-FDA-1088. Where should I keep my medicine? Keep out of the reach of children. Store at room  temperature between 20 and 25 degrees C (68 and 77 degrees F). Throw away any unused medicine after the expiration date. NOTE: This sheet is a summary. It may not cover all possible information. If you have questions about this medicine, talk to your doctor, pharmacist, or health care provider.  2020 Elsevier/Gold Standard (2018-12-28 12:32:38)

## 2020-03-21 NOTE — Telephone Encounter (Signed)
[facility] pending uploaded notes on the portal.

## 2020-03-21 NOTE — Progress Notes (Signed)
[facility]    Provider:  Dr Jaynee Eagles Requesting Provider: Truddie Hidden, MD (ED) Primary Care Provider:  Patient, No Pcp Per  CC: Headaches  HPI:  Melissa Cisneros is a 39 y.o. 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 Remote history of migraines. She has seen Dr. Tommi Rumps at [facility] in neurosurgery and follows there. She is sent here for headache after being seen in the emergency room on 03/08/2020.  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  Her dizziness/nystagmus only happens when her headache becomes severe and improves when the headache subsides.  When seen in the emergency room she was feeling better although still some nausea.  Physical examination in the emergency room was normal, no nystagmus was noted.  Labs were unremarkable except for some mild anemia 11.4/35.5 on CBC, sodium 132 on CMP, glucose 138 on CMP, otherwise unremarkable.  She was given a cocktail of Zofran, morphine, Toradol.  EKG with mild bradycardia.  CT of the head showed no acute findings however slight enlargement of the pineal gland, also "definite increase" in size of the lateral and third ventricles measuring 1 to 2 mm larger than on the previous study.  She saw Dr. Christella Noa and then saw Dr. Tommi Rumps. It was discovered in the ED in November due to  headache. In December she had another headache and vertigo and syncope and was seen at the ED again and then she saw Dr. Tommi Rumps who thought it was a benign mass and surgery not needed she will see him in 6 months. She has a remote history of migraines around her period and recent headaches different felt different. New type of headache started in November, much different, felt like on top/middle of brain and burning and neck tightness. She tried Flexeril and didn;t help, she tried acupuncture, the headache was persistent and it was so severe couldn;t sleep, she heard a constant pulsation in her ears, the pulsation gets so bad she has to stop, light/sound sensitivity and nausea, more midline. The vertigo is positional, she feels like she is going to black out and she wakes up on the floor. She has had syncope 3 times, she got up out of the tub most recently and she was holding onto the tub, she felt like someone hit her and she lost consciousness. She had an episode of nystagmus and she could not stay upright, she fell and hit the floor, she lost consciousness, she feels clammy. She stopped driving. She is worried about safety. She has diplopia and vision changes. No other focal neurologic deficits, associated symptoms, inciting events or modifiable factors.  Reviewed notes, labs and imaging from outside physicians, which showed:  I reviewed Dr. Shanda Bumps notes from neurosurgery at [facility], examination showed normal cranial nerves, normal muscle strength and tone, normal sensation, normal reflexes, normal coordination.  She was seen March 13, 2020.  He reviewed her CT scan which showed a hyperdense area that seems to be solid in the pineal gland, he also reviewed the MRI of the scan, he was uncertain whether this could just be a complex pineal cyst, he did not see hydrocephalus when he compared the 2 CT scans, may be a complex pineal cyst.  MRI brain 01/29/2020(repeat pending 03/31/2020): 1. Pineal gland  mass measuring 12 x 14 mm with small area of contrast enhancement at the anterior aspect of the mass. 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 2. No hydrocephalus.  CT Head 03/08/2020: IMPRESSION: Redemonstrated is a hyperdense mass of the pineal gland measuring 2.3 x 1.8 x 2.2 cm. Measurements previously were 2.3 x 1.7 x 1.9 cm, indicating slight enlargement. Slight but definite increase in size of the lateral and third ventricles, measuring 1-2 mm larger than on the previous study.  Review of Systems: Patient complains of symptoms per HPI as well as the following symptoms: headache, diplopia. Pertinent negatives and positives per HPI. All others negative.   Social History   Socioeconomic History  . Marital status: Divorced    Spouse name: Not on file  . Number of children: 3  . Years of education: Not on file  . Highest education level: Not on file  Occupational History  . Not on file  Tobacco Use  . Smoking status: Never Smoker  . Smokeless tobacco: Never Used  Vaping Use  . Vaping Use: Never used  Substance and Sexual Activity  . Alcohol use: No  . Drug use: No  . Sexual activity: Yes    Birth control/protection: Patch  Other Topics Concern  . Not on file  Social History Narrative   Lives at home with her children   Right handed   Caffeine: none    Social Determinants of Health   Financial Resource Strain: Not on file  Food Insecurity: Not on file  Transportation Needs: Not on file  Physical Activity: Not on file  Stress: Not on file  Social Connections: Not on file  Intimate Partner Violence: Not on file    Family History  Problem Relation Age of Onset  . Hypertension Mother   . Diabetes Father   . Diabetes Sister   . Diabetes Paternal Grandmother   . Hypertension Paternal Grandmother   . Migraines Neg Hx     Past Medical History:  Diagnosis Date  . Anemia     Patient Active Problem List   Diagnosis Date  Noted  . 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 Obstructive hydrocephalus (HCC) 03/21/2020    Past Surgical History:  Procedure Laterality Date  . CESAREAN SECTION  2016  . DILATION AND EVACUATION N/A 03/06/2017   Procedure: DILATATION AND EVACUATION;  Surgeon: Tracey Harries, MD;  Location: [facility];  Service: Gynecology;  Laterality: N/A;    Current Outpatient Medications  Medication Sig Dispense Refill  . acetaZOLAMIDE (DIAMOX) 250 MG tablet Take 1 tablet (250 mg total) by mouth 2 (two) times daily. 60 tablet 6  . aspirin-acetaminophen-caffeine (EXCEDRIN MIGRAINE) 250-250-65 MG tablet Take 1 tablet by mouth every 6 (six) hours as needed for headache.    . butalbital-acetaminophen-caffeine (FIORICET) 50-325-40 MG tablet Take 1-2 tablets by mouth every 6 (six) hours as needed for headache. 20 tablet 0  . ibuprofen (ADVIL) 800 MG tablet Take 800 mg by mouth every 8 (eight) hours.    Marland Kitchen  ondansetron (ZOFRAN ODT) 4 MG disintegrating tablet Take 1 tablet (4 mg total) by mouth every 8 (eight) hours as needed for nausea or vomiting. 20 tablet 0  . XULANE 150-35 MCG/24HR transdermal patch Place 1 patch onto the skin once a week.     No current facility-administered medications for this visit.    Allergies as of 03/21/2020 - Review Complete 03/21/2020  Allergen Reaction Noted  . Flexeril [cyclobenzaprine] Other (See Comments) 03/08/2020  . Gabapentin Other (See Comments) 03/08/2020    Vitals: BP 129/83 (BP Location: Right Arm, Patient Position: Sitting)   Pulse 75   Ht 5\' 6"  (1.676 m)   Wt 171 lb (77.6 kg)   BMI 27.60 kg/m  Last Weight:  Wt Readings from Last 1 Encounters:  03/21/20 171 lb (77.6 kg)   Last Height:   Ht Readings from Last 1 Encounters:  03/21/20 5\' 6"  (1.676 m)     Physical exam: Exam: Gen: NAD, conversant, well nourised, well groomed                     CV: RRR, no MRG. No Carotid Bruits. No  peripheral edema, warm, nontender Eyes: Conjunctivae clear without exudates or hemorrhage  Neuro: Detailed Neurologic Exam  Speech:    Speech is normal; fluent and spontaneous with normal comprehension.  Cognition:    The patient is oriented to person, place, and time;     recent and remote memory intact;     language fluent;     normal attention, concentration,     fund of knowledge Cranial Nerves:    The pupils are equal, round, and reactive to light. Severe papilledema, left 6th nerve palsy.[place]i [place] fields are full to threat. Extraocular movements are intact. Trigeminal sensation is intact and the muscles of mastication are normal. The face is symmetric. The palate elevates in the midline. Hearing intact. Voice is normal. Shoulder shrug is normal. The tongue has normal motion without fasciculations.   Coordination:    No dysmetria   Gait:    Normal native gait  Motor Observation:    No asymmetry, no atrophy, and no involuntary movements noted. Tone:    Normal muscle tone.    Posture:    Posture is normal. normal erect    Strength:    Strength is V/V in the upper and lower limbs.      Sensation: intact to LT     Reflex Exam:  DTR's:    Deep tendon reflexes in the upper and lower extremities are normal bilaterally.   Toes:    The toes are downgoing bilaterally.   Clonus:    Clonus is absent.    Assessment/Plan:  Patient with newly-diagnosed pineal glad tumor likely causing obstructive hydrocephalus and increased intracranial pressure. Patient with headache, significant papilledema and left sixth nerve palsy. Spoke with Dr. Katy Fitch who saw her urgently after my appointment, ophthalmology, who confirmed my findings. Dr. Zenia Resides office called Dr. Tommi Rumps at [facility] to report patient needs to be seen urgently, [facility] stated he will see her Tuesday but I am concerned this is not soon enough. I spoke with patient after her ophtho exam and recommended stat CT head and possibly  either the [facility] ED or, if she really wants Dr. Tommi Rumps, she would have to go to [facility] ED. She stated she would call [facility] to see if she could have the MRI today(originally scheduled 1/15), she will contact me back, but I encouraged stat imaging today and going  to the ED. I started diamox. Explained risk of permanent vision loss or blindness. Episodes of loss of consciousness likely vaso-vagal due to pain but still need EEG however seizure less likely. Appreciate my colleague Dr. Midge Aver.  Orders Placed This Encounter  Procedures  . CT HEAD WO CONTRAST  . EEG   Meds ordered this encounter  Medications  . acetaZOLAMIDE (DIAMOX) 250 MG tablet    Sig: Take 1 tablet (250 mg total) by mouth 2 (two) times daily.    Dispense:  60 tablet    Refill:  6    Cc: Truddie Hidden, MD,  Dr. Tommi Rumps at [facility] and Dr. Midge Aver  Sarina Ill, MD  [facility] [address] [place], [place] [address]  Phone [phone] Fax [phone]  I spent over 100 minutes of face-to-face and non-face-to-face time with patient on the  1. Pineal gland, tumor   2. Papilledema   3. Loss of consciousness ([place])   4. Other headache syndrome   5. Left abducens nerve palsy   6. Obstructive hydrocephalus (HCC)    diagnosis.  This included previsit chart review, lab review, study review, order entry, electronic health record documentation, patient education on the different diagnostic and therapeutic options, counseling and coordination of care, risks and benefits of management, compliance, or risk factor reduction

## 2020-03-22 ENCOUNTER — Telehealth: Payer: Self-pay | Admitting: Neurology

## 2020-03-22 ENCOUNTER — Other Ambulatory Visit: Payer: Self-pay

## 2020-03-22 ENCOUNTER — Encounter (HOSPITAL_COMMUNITY)
Admission: EM | Disposition: A | Discharge status: Other Acute Inpatient Hospital | Payer: Self-pay | Source: Home / Self Care | Attending: Neurosurgery

## 2020-03-22 ENCOUNTER — Inpatient Hospital Stay (HOSPITAL_COMMUNITY): Payer: 59 | Admitting: Certified Registered Nurse Anesthetist

## 2020-03-22 ENCOUNTER — Inpatient Hospital Stay: Admission: RE | Admit: 2020-03-22 | Payer: 59 | Source: Ambulatory Visit

## 2020-03-22 ENCOUNTER — Emergency Department (HOSPITAL_COMMUNITY): Payer: 59

## 2020-03-22 ENCOUNTER — Encounter (HOSPITAL_COMMUNITY): Payer: Self-pay | Admitting: Emergency Medicine

## 2020-03-22 ENCOUNTER — Inpatient Hospital Stay (HOSPITAL_COMMUNITY)
Admission: EM | Admit: 2020-03-22 | Discharge: 2020-03-24 | DRG: 025 | Disposition: A | Discharge status: Other Acute Inpatient Hospital | Payer: 59 | Attending: Neurosurgery | Admitting: Neurosurgery

## 2020-03-22 DIAGNOSIS — D497 Neoplasm of unspecified behavior of endocrine glands and other parts of nervous system: Secondary | ICD-10-CM | POA: Diagnosis present

## 2020-03-22 DIAGNOSIS — H4922 Sixth [abducent] nerve palsy, left eye: Secondary | ICD-10-CM | POA: Diagnosis present

## 2020-03-22 DIAGNOSIS — Z20822 Contact with and (suspected) exposure to covid-19: Secondary | ICD-10-CM | POA: Diagnosis present

## 2020-03-22 DIAGNOSIS — I615 Nontraumatic intracerebral hemorrhage, intraventricular: Secondary | ICD-10-CM | POA: Diagnosis present

## 2020-03-22 DIAGNOSIS — G9389 Other specified disorders of brain: Secondary | ICD-10-CM

## 2020-03-22 DIAGNOSIS — G911 Obstructive hydrocephalus: Principal | ICD-10-CM | POA: Diagnosis present

## 2020-03-22 HISTORY — PX: VENTRICULOSTOMY: SHX5377

## 2020-03-22 LAB — RESP PANEL BY RT-PCR (FLU A&B, COVID) ARPGX2
Influenza A by PCR: NEGATIVE
Influenza B by PCR: NEGATIVE
SARS Coronavirus 2 by RT PCR: NEGATIVE

## 2020-03-22 LAB — COMPREHENSIVE METABOLIC PANEL
ALT: 17 U/L (ref 0–44)
AST: 17 U/L (ref 15–41)
Albumin: 3.4 g/dL — ABNORMAL LOW (ref 3.5–5.0)
Alkaline Phosphatase: 107 U/L (ref 38–126)
Anion gap: 10 (ref 5–15)
BUN: 13 mg/dL (ref 6–20)
CO2: 19 mmol/L — ABNORMAL LOW (ref 22–32)
Calcium: 9.4 mg/dL (ref 8.9–10.3)
Chloride: 104 mmol/L (ref 98–111)
Creatinine, Ser: 0.84 mg/dL (ref 0.44–1.00)
GFR, Estimated: >60 mL/min (ref >60)
Glucose, Bld: 172 mg/dL — ABNORMAL HIGH (ref 70–99)
Potassium: 3.9 mmol/L (ref 3.5–5.1)
Sodium: 133 mmol/L — ABNORMAL LOW (ref 135–145)
Total Bilirubin: 0.6 mg/dL (ref 0.3–1.2)
Total Protein: 7.5 g/dL (ref 6.5–8.1)

## 2020-03-22 LAB — CBC
HCT: 42.4 % (ref 36.0–46.0)
Hemoglobin: 13.5 g/dL (ref 12.0–15.0)
MCH: 28.4 pg (ref 26.0–34.0)
MCHC: 31.8 g/dL (ref 30.0–36.0)
MCV: 89.1 fL (ref 80.0–100.0)
Platelets: 315 10*3/uL (ref 150–400)
RBC: 4.76 MIL/uL (ref 3.87–5.11)
RDW: 14.3 % (ref 11.5–15.5)
WBC: 14.6 10*3/uL — ABNORMAL HIGH (ref 4.0–10.5)
nRBC: 0 % (ref 0.0–0.2)

## 2020-03-22 LAB — PREGNANCY, URINE: Preg Test, Ur: NEGATIVE

## 2020-03-22 LAB — PROTIME-INR
INR: 1 (ref 0.8–1.2)
Prothrombin Time: 12.5 s (ref 11.4–15.2)

## 2020-03-22 SURGERY — VENTRICULOSTOMY
Anesthesia: General | Site: Head | Laterality: Right

## 2020-03-22 MED ORDER — ONDANSETRON HCL 4 MG/2ML IJ SOLN
INTRAMUSCULAR | Status: DC | PRN
  Administered 2020-03-22: 4 mg via INTRAVENOUS

## 2020-03-22 MED ORDER — ACETAMINOPHEN 650 MG RE SUPP: 650.0000 mg | RECTAL | Status: DC | PRN

## 2020-03-22 MED ORDER — LABETALOL HCL 5 MG/ML IV SOLN
10.0000 mg | INTRAVENOUS | Status: DC | PRN
  Administered 2020-03-22 (×4): 10 mg via INTRAVENOUS
  Filled 2020-03-22 (×3): qty 4

## 2020-03-22 MED ORDER — HYDROMORPHONE HCL 1 MG/ML IJ SOLN
1.0000 mg | Freq: Once | INTRAMUSCULAR | Status: AC
  Administered 2020-03-22: 1 mg via INTRAVENOUS
  Filled 2020-03-22: qty 1

## 2020-03-22 MED ORDER — ACETAZOLAMIDE 250 MG PO TABS
250.0000 mg | ORAL_TABLET | Freq: Two times a day (BID) | ORAL | Status: DC
  Administered 2020-03-23 (×3): 250 mg via ORAL
  Filled 2020-03-22 (×3): qty 1

## 2020-03-22 MED ORDER — LIDOCAINE 2% (20 MG/ML) 5 ML SYRINGE
INTRAMUSCULAR | Status: AC
  Filled 2020-03-22: qty 5

## 2020-03-22 MED ORDER — PHENYLEPHRINE HCL (PRESSORS) 10 MG/ML IV SOLN
INTRAVENOUS | Status: DC | PRN
  Administered 2020-03-22: 40 ug via INTRAVENOUS

## 2020-03-22 MED ORDER — ACETAMINOPHEN 325 MG PO TABS: 650.0000 mg | ORAL_TABLET | ORAL | Status: DC | PRN

## 2020-03-22 MED ORDER — SUCCINYLCHOLINE CHLORIDE 200 MG/10ML IV SOSY
PREFILLED_SYRINGE | INTRAVENOUS | Status: AC
  Filled 2020-03-22: qty 10

## 2020-03-22 MED ORDER — FLEET ENEMA 7-19 GM/118ML RE ENEM: 1.0000 | ENEMA | Freq: Once | RECTAL | Status: DC | PRN

## 2020-03-22 MED ORDER — CEFAZOLIN SODIUM 1 G IJ SOLR
INTRAMUSCULAR | Status: AC
  Filled 2020-03-22: qty 20

## 2020-03-22 MED ORDER — MEPERIDINE HCL 25 MG/ML IJ SOLN: 6.2500 mg | INTRAMUSCULAR | Status: DC | PRN

## 2020-03-22 MED ORDER — FENTANYL CITRATE (PF) 250 MCG/5ML IJ SOLN
INTRAMUSCULAR | Status: DC | PRN
  Administered 2020-03-22: 50 ug via INTRAVENOUS
  Administered 2020-03-22: 100 ug via INTRAVENOUS

## 2020-03-22 MED ORDER — HEPARIN SODIUM (PORCINE) 5000 UNIT/ML IJ SOLN: 5000.0000 [IU] | Freq: Two times a day (BID) | INTRAMUSCULAR | Status: DC

## 2020-03-22 MED ORDER — PROPOFOL 10 MG/ML IV BOLUS
INTRAVENOUS | Status: DC | PRN
  Administered 2020-03-22: 40 mg via INTRAVENOUS
  Administered 2020-03-22: 160 mg via INTRAVENOUS

## 2020-03-22 MED ORDER — DEXAMETHASONE SODIUM PHOSPHATE 10 MG/ML IJ SOLN
INTRAMUSCULAR | Status: DC | PRN
  Administered 2020-03-22: 10 mg via INTRAVENOUS

## 2020-03-22 MED ORDER — CEFAZOLIN SODIUM-DEXTROSE 2-3 GM-%(50ML) IV SOLR
INTRAVENOUS | Status: DC | PRN
  Administered 2020-03-22: 2 g via INTRAVENOUS

## 2020-03-22 MED ORDER — NORELGESTROMIN-ETH ESTRADIOL 150-35 MCG/24HR TD PTWK: 1.0000 | MEDICATED_PATCH | TRANSDERMAL | Status: DC

## 2020-03-22 MED ORDER — SUGAMMADEX SODIUM 200 MG/2ML IV SOLN
INTRAVENOUS | Status: DC | PRN
  Administered 2020-03-22: 200 mg via INTRAVENOUS

## 2020-03-22 MED ORDER — CHLORHEXIDINE GLUCONATE CLOTH 2 % EX PADS
6.0000 | MEDICATED_PAD | Freq: Every day | CUTANEOUS | Status: DC
  Administered 2020-03-22: 6 via TOPICAL

## 2020-03-22 MED ORDER — OXYCODONE HCL 5 MG/5ML PO SOLN: 5.0000 mg | Freq: Once | ORAL | Status: DC | PRN

## 2020-03-22 MED ORDER — SODIUM CHLORIDE 0.9 % IV SOLN: INTRAVENOUS | Status: AC

## 2020-03-22 MED ORDER — MIDAZOLAM HCL 2 MG/2ML IJ SOLN: 0.5000 mg | Freq: Once | INTRAMUSCULAR | Status: DC | PRN

## 2020-03-22 MED ORDER — ENALAPRILAT 1.25 MG/ML IV SOLN: 1.2500 mg | Freq: Four times a day (QID) | INTRAVENOUS | Status: DC | PRN

## 2020-03-22 MED ORDER — MORPHINE SULFATE (PF) 2 MG/ML IV SOLN: 1.0000 mg | INTRAVENOUS | Status: DC | PRN

## 2020-03-22 MED ORDER — DOCUSATE SODIUM 100 MG PO CAPS
100.0000 mg | ORAL_CAPSULE | Freq: Two times a day (BID) | ORAL | Status: DC
  Administered 2020-03-23 (×3): 100 mg via ORAL
  Filled 2020-03-22 (×3): qty 1

## 2020-03-22 MED ORDER — GABAPENTIN 300 MG PO CAPS
300.0000 mg | ORAL_CAPSULE | Freq: Two times a day (BID) | ORAL | Status: DC
  Administered 2020-03-23 (×3): 300 mg via ORAL
  Filled 2020-03-22 (×3): qty 1

## 2020-03-22 MED ORDER — IOHEXOL 350 MG/ML SOLN
75.0000 mL | Freq: Once | INTRAVENOUS | Status: AC | PRN
  Administered 2020-03-22: 75 mL via INTRAVENOUS

## 2020-03-22 MED ORDER — ONDANSETRON HCL 4 MG PO TABS: 4.0000 mg | ORAL_TABLET | ORAL | Status: DC | PRN

## 2020-03-22 MED ORDER — OXYCODONE HCL 5 MG PO TABS
5.0000 mg | ORAL_TABLET | Freq: Once | ORAL | Status: DC | PRN
Start: 2020-03-22 — End: 2020-03-22

## 2020-03-22 MED ORDER — ARTIFICIAL TEARS OPHTHALMIC OINT
TOPICAL_OINTMENT | OPHTHALMIC | Status: AC
  Filled 2020-03-22: qty 3.5

## 2020-03-22 MED ORDER — LIDOCAINE HCL (CARDIAC) PF 100 MG/5ML IV SOSY
PREFILLED_SYRINGE | INTRAVENOUS | Status: DC | PRN
  Administered 2020-03-22: 20 mg via INTRATRACHEAL

## 2020-03-22 MED ORDER — LACTATED RINGERS IV SOLN: INTRAVENOUS | Status: DC | PRN

## 2020-03-22 MED ORDER — THROMBIN 5000 UNITS EX SOLR
CUTANEOUS | Status: AC
  Filled 2020-03-22: qty 10000

## 2020-03-22 MED ORDER — GADOBUTROL 1 MMOL/ML IV SOLN
8.0000 mL | Freq: Once | INTRAVENOUS | Status: AC | PRN
  Administered 2020-03-22: 8 mL via INTRAVENOUS

## 2020-03-22 MED ORDER — POTASSIUM CHLORIDE IN NACL 20-0.9 MEQ/L-% IV SOLN
INTRAVENOUS | Status: DC
  Filled 2020-03-22 (×2): qty 1000

## 2020-03-22 MED ORDER — ROCURONIUM BROMIDE 100 MG/10ML IV SOLN
INTRAVENOUS | Status: DC | PRN
  Administered 2020-03-22: 50 mg via INTRAVENOUS

## 2020-03-22 MED ORDER — PROMETHAZINE HCL 25 MG/ML IJ SOLN: 6.2500 mg | INTRAMUSCULAR | Status: DC | PRN

## 2020-03-22 MED ORDER — ROCURONIUM BROMIDE 10 MG/ML (PF) SYRINGE
PREFILLED_SYRINGE | INTRAVENOUS | Status: AC
  Filled 2020-03-22: qty 10

## 2020-03-22 MED ORDER — NICARDIPINE HCL IN NACL 20-0.86 MG/200ML-% IV SOLN: 3.0000 mg/h | INTRAVENOUS | Status: DC

## 2020-03-22 MED ORDER — HYDROMORPHONE HCL 1 MG/ML IJ SOLN
INTRAMUSCULAR | Status: AC
  Filled 2020-03-22: qty 1

## 2020-03-22 MED ORDER — LIDOCAINE-EPINEPHRINE 1.5 %-1:200,000 OPTIME - NO CHARGE
INTRAMUSCULAR | Status: DC | PRN
  Administered 2020-03-22: 5 mL via SUBCUTANEOUS

## 2020-03-22 MED ORDER — DEXAMETHASONE SODIUM PHOSPHATE 10 MG/ML IJ SOLN
10.0000 mg | Freq: Once | INTRAMUSCULAR | Status: AC
  Administered 2020-03-22: 10 mg via INTRAVENOUS
  Filled 2020-03-22: qty 1

## 2020-03-22 MED ORDER — POLYETHYLENE GLYCOL 3350 17 G PO PACK: 17.0000 g | PACK | Freq: Every day | ORAL | Status: DC | PRN

## 2020-03-22 MED ORDER — ONDANSETRON HCL 4 MG/2ML IJ SOLN: 4.0000 mg | INTRAMUSCULAR | Status: DC | PRN

## 2020-03-22 MED ORDER — LABETALOL HCL 5 MG/ML IV SOLN
INTRAVENOUS | Status: DC | PRN
  Administered 2020-03-22 (×2): 5 mg via INTRAVENOUS
  Administered 2020-03-22: 10 mg via INTRAVENOUS

## 2020-03-22 MED ORDER — SODIUM CHLORIDE 0.9 % IV SOLN: INTRAVENOUS | Status: DC | PRN

## 2020-03-22 MED ORDER — LEVETIRACETAM 500 MG PO TABS
500.0000 mg | ORAL_TABLET | Freq: Two times a day (BID) | ORAL | Status: DC
  Administered 2020-03-23 (×3): 500 mg via ORAL
  Filled 2020-03-22 (×3): qty 1

## 2020-03-22 MED ORDER — HYDROMORPHONE HCL 1 MG/ML IJ SOLN
0.2500 mg | INTRAMUSCULAR | Status: DC | PRN
  Administered 2020-03-22: 0.25 mg via INTRAVENOUS

## 2020-03-22 MED ORDER — PANTOPRAZOLE SODIUM 40 MG PO TBEC
40.0000 mg | DELAYED_RELEASE_TABLET | Freq: Every day | ORAL | Status: DC
  Administered 2020-03-23: 40 mg via ORAL
  Filled 2020-03-22: qty 1

## 2020-03-22 MED ORDER — FENTANYL CITRATE (PF) 250 MCG/5ML IJ SOLN
INTRAMUSCULAR | Status: AC
  Filled 2020-03-22: qty 5

## 2020-03-22 MED ORDER — 0.9 % SODIUM CHLORIDE (POUR BTL) OPTIME
TOPICAL | Status: DC | PRN
  Administered 2020-03-22: 1000 mL

## 2020-03-22 MED ORDER — ONDANSETRON HCL 4 MG/2ML IJ SOLN
INTRAMUSCULAR | Status: AC
  Filled 2020-03-22: qty 2

## 2020-03-22 MED ORDER — SODIUM CHLORIDE 0.9 % IV SOLN: INTRAVENOUS | Status: DC

## 2020-03-22 MED ORDER — PROMETHAZINE HCL 25 MG PO TABS: 12.5000 mg | ORAL_TABLET | ORAL | Status: DC | PRN

## 2020-03-22 MED ORDER — DEXAMETHASONE 4 MG PO TABS
4.0000 mg | ORAL_TABLET | Freq: Three times a day (TID) | ORAL | Status: DC
  Administered 2020-03-23 (×4): 4 mg via ORAL
  Filled 2020-03-22 (×4): qty 1

## 2020-03-22 MED ORDER — PROPOFOL 10 MG/ML IV BOLUS
INTRAVENOUS | Status: AC
  Filled 2020-03-22: qty 40

## 2020-03-22 SURGICAL SUPPLY — 45 items
BENZOIN TINCTURE PRP APPL 2/3 (GAUZE/BANDAGES/DRESSINGS) ×2 IMPLANT
BIOPATCH WHT 1IN DISK W/4.0 H (GAUZE/BANDAGES/DRESSINGS) ×2 IMPLANT
BLADE SURG 15 STRL LF DISP TIS (BLADE) ×1 IMPLANT
BLADE SURG 15 STRL SS (BLADE) ×1
CABLE BIPOLOR RESECTION CORD (MISCELLANEOUS) ×2 IMPLANT
CANISTER SUCT 3000ML PPV (MISCELLANEOUS) ×2 IMPLANT
CARTRIDGE OIL MAESTRO DRILL (MISCELLANEOUS) ×1 IMPLANT
CATH VENTRIC 35X38 W/TROCAR LG (CATHETERS) ×2 IMPLANT
CATH VENTRICULAR ANTIMICRO (CATHETERS) ×1 IMPLANT
CATHETER VENTRICULAR ANTIMICRO (CATHETERS) ×2
DECANTER SPIKE VIAL GLASS SM (MISCELLANEOUS) ×2 IMPLANT
DIFFUSER DRILL AIR PNEUMATIC (MISCELLANEOUS) ×2 IMPLANT
DRAPE HALF SHEET 40X57 (DRAPES) ×2 IMPLANT
DRSG OPSITE 4X5.5 SM (GAUZE/BANDAGES/DRESSINGS) ×2 IMPLANT
DURAPREP 6ML APPLICATOR 50/CS (WOUND CARE) ×2 IMPLANT
GAUZE 4X4 16PLY RFD (DISPOSABLE) ×2 IMPLANT
GAUZE SPONGE 4X4 12PLY STRL (GAUZE/BANDAGES/DRESSINGS) IMPLANT
GLOVE BIO SURGEON STRL SZ7.5 (GLOVE) ×2 IMPLANT
GLOVE BIOGEL PI IND STRL 7.5 (GLOVE) ×1 IMPLANT
GLOVE BIOGEL PI INDICATOR 7.5 (GLOVE) ×1
GLOVE EXAM NITRILE XL STR (GLOVE) ×2 IMPLANT
GOWN STRL REUS W/ TWL LRG LVL3 (GOWN DISPOSABLE) ×1 IMPLANT
GOWN STRL REUS W/ TWL XL LVL3 (GOWN DISPOSABLE) IMPLANT
GOWN STRL REUS W/TWL 2XL LVL3 (GOWN DISPOSABLE) IMPLANT
GOWN STRL REUS W/TWL LRG LVL3 (GOWN DISPOSABLE) ×1
GOWN STRL REUS W/TWL XL LVL3 (GOWN DISPOSABLE)
KIT BASIN OR (CUSTOM PROCEDURE TRAY) ×2 IMPLANT
KIT DRAIN CSF ACCUDRAIN (MISCELLANEOUS) ×2 IMPLANT
KIT TURNOVER KIT B (KITS) ×2 IMPLANT
NEEDLE HYPO 18GX1.5 BLUNT FILL (NEEDLE) IMPLANT
NEEDLE HYPO 25X1 1.5 SAFETY (NEEDLE) ×2 IMPLANT
NS IRRIG 1000ML POUR BTL (IV SOLUTION) ×2 IMPLANT
OIL CARTRIDGE MAESTRO DRILL (MISCELLANEOUS) ×2
PACK EENT II TURBAN DRAPE (CUSTOM PROCEDURE TRAY) ×2 IMPLANT
SET POST CRANIOTOMY SUBDURAL (MISCELLANEOUS) IMPLANT
SUT ETHILON 3 0 PS 1 (SUTURE) IMPLANT
SUT SILK 2 0 PERMA HAND 18 BK (SUTURE) ×2 IMPLANT
SUT VIC AB 2-0 CT2 18 VCP726D (SUTURE) IMPLANT
SUT VIC AB 3-0 SH 8-18 (SUTURE) IMPLANT
SYR BULB EAR ULCER 3OZ GRN STR (SYRINGE) ×2 IMPLANT
TOWEL GREEN STERILE (TOWEL DISPOSABLE) ×2 IMPLANT
TOWEL GREEN STERILE FF (TOWEL DISPOSABLE) ×2 IMPLANT
TRAY FOLEY MTR SLVR 16FR STAT (SET/KITS/TRAYS/PACK) IMPLANT
TUBE CONNECTING 12X1/4 (SUCTIONS) IMPLANT
UNDERPAD 30X36 HEAVY ABSORB (UNDERPADS AND DIAPERS) IMPLANT

## 2020-03-22 NOTE — Telephone Encounter (Signed)
I spoke with patient, her vision is worse. I am trying to call [facility] to speak with Dr. Zachery Conch. I have encouraged patient to go to the emergency room. I am worried about permanent vision loss or blindness and I discussed this with patient.

## 2020-03-22 NOTE — Telephone Encounter (Signed)
Dr Lucia Gaskins was able to speak with Dr Santiago Glad nurse. Dr Lucia Gaskins is referring pt to ER urgently. Will call triage to notify of pt's pending arrival.

## 2020-03-22 NOTE — Op Note (Signed)
PREOP DX: Hydrocephalus  POSTOP DX: Hydrocephalus  PROCEDURE: Right frontal ventriculostomy   SURGEON: Dr. Hoyt Koch, MD  ANESTHESIA: GETA with Local  EBL: Minimal  SPECIMENS: None  COMPLICATIONS: None  CONDITION: Hemodynamically stable  INDICATIONS: Mrs. Melissa Cisneros is a 40 y.o. female with a pineal region mass who developed acute worsening of her headache, alongside bilateral papilledema and 6th nerve palsy.  She was found to have small intraventricular hemorrhage as well as hemorrhage around her tumor in the quadrigeminal cistern.  Her ventricles had slightly enlarged in size.  Given her overt elevated intracranial pressure symptoms, I recommended CSF diversion.  With the presence of intraventricular blood as well as possibility of additional surgery in the near future, I recommended temporary ventricular drain placement.  Risks, benefits, alternatives, and expected convalescence were discussed with her and her ex-husband at the bedside.  She wished to proceed with surgery.  As there were no ICU beds presently available, I elected to perform this in the operating room.  PROCEDURE IN DETAIL: After general anesthesia was induced and patient was intubated, patient was positioned supine.  The right frontal scalp was clipped, prepped and draped in the usual sterile fashion.  A timeout was performed.  Scalp was then infiltrated with local anesthetic with epinephrine.  Skin incision was made sharply, and twist drill burr hole was made.  The dura was then incised, and the ventricular catheter was passed at Kocher's point to cannulate the ventricle using the usual landmarks on first pass.  Slightly blood-tinged CSF under high pressure, greater than 30 cm, was encountered.  Good CSF flow was obtained and CSF specimen was sent for lab specimen.  The catheter was then tunneled subcutaneously and connected to a drainage system and the skin incision closed with 2-0 Vicryl in the galeal and dermal  layer followed by staples..  The drain was then secured in place and connected to the drainage system.  Patient was then extubated by the anesthesia service.  All counts were correct at the end of surgery peer  FINDINGS: 1. Opening pressure > 30 cm H20 2. Blood tinged CSF

## 2020-03-22 NOTE — Anesthesia Postprocedure Evaluation (Signed)
Anesthesia Post Note  Patient: Arts development officer  Procedure(s) Performed: PLACEMENT OF EXTERNAL VENTRICULAR DRAIN (Right Head)     Patient location during evaluation: PACU Anesthesia Type: General Level of consciousness: patient cooperative, oriented and sedated Pain management: pain level controlled Vital Signs Assessment: post-procedure vital signs reviewed and stable Respiratory status: spontaneous breathing, nonlabored ventilation and respiratory function stable Cardiovascular status: blood pressure returned to baseline and stable Postop Assessment: no apparent nausea or vomiting Anesthetic complications: no   No complications documented.  Last Vitals:  Vitals:   03/22/20 2145 03/22/20 2200  BP: (!) 149/78   Pulse: 64   Resp: 18   Temp:  (!) 36.2 C  SpO2: 100%     Last Pain:  Vitals:   03/22/20 2140  TempSrc:   PainSc: Asleep                 Arlo Buffone,E. Clarice Zulauf

## 2020-03-22 NOTE — Anesthesia Preprocedure Evaluation (Addendum)
Anesthesia Evaluation  Patient identified by MRN, date of birth, ID band Patient awake    Reviewed: Allergy & Precautions, NPO status , Patient's Chart, lab work & pertinent test results  History of Anesthesia Complications Negative for: history of anesthetic complications  Airway Mallampati: I  TM Distance: >3 FB Neck ROM: Full    Dental  (+) Teeth Intact, Dental Advisory Given   Pulmonary neg pulmonary ROS,  03/22/2020 SARS coronavirus NEG   breath sounds clear to auscultation       Cardiovascular (-) hypertensionnegative cardio ROS   Rhythm:Regular Rate:Normal     Neuro/Psych  Headaches, Mental status changes  H/o Pineal gland tumor with obstructive hydrocephalus  CT today: Acute hemorrhage related to the known pineal tumor, with local mass effect on the tectum contributing to early hydrocephalus. Extension of the hemorrhage adjacent to the pineal tumor involves third ventricle and the right lateral ventricle. negative psych ROS   GI/Hepatic negative GI ROS, Neg liver ROS,   Endo/Other  negative endocrine ROS  Renal/GU negative Renal ROS     Musculoskeletal   Abdominal   Peds  Hematology negative hematology ROS (+)   Anesthesia Other Findings   Reproductive/Obstetrics                            Anesthesia Physical Anesthesia Plan  ASA: III  Anesthesia Plan: General   Post-op Pain Management:    Induction: Intravenous  PONV Risk Score and Plan: 3 and Ondansetron and Dexamethasone  Airway Management Planned: Oral ETT  Additional Equipment: Arterial line  Intra-op Plan:   Post-operative Plan: Possible Post-op intubation/ventilation  Informed Consent: I have reviewed the patients History and Physical, chart, labs and discussed the procedure including the risks, benefits and alternatives for the proposed anesthesia with the patient or authorized representative who has  indicated his/her understanding and acceptance.     Dental advisory given  Plan Discussed with: CRNA and Surgeon  Anesthesia Plan Comments:        Anesthesia Quick Evaluation

## 2020-03-22 NOTE — ED Provider Notes (Signed)
Signed out that MRI pending, and NS coming to see.   NS, Dr Maisie Fus, indicates he has followed up on studies, and has plans to take to OR tonight.   Dr Zachery Conch, NS at [facility] also update on imaging studies. He indicates currently no beds at [facility] to transfer into, but that if pt/family desires, we can contact transfer center/office and patient can be made a pending transfer.   For now, patient/family wanting to precede with surgery/admission here - arrangements in process now.     Cathren Laine, MD 03/22/20 1743

## 2020-03-22 NOTE — ED Notes (Signed)
Pt transported to MRI 

## 2020-03-22 NOTE — Consult Note (Signed)
CC: HA  HPI:     Patient is a 40 y.o. female with a pineal region mass who presents for 1 week of headache, double vision, and vision changes.  She was diagnosed with a pineal region mass in November after complaints of headache.  She was evaluated by Dr. Franky Machoabbell of [facility] as well as Dr. Zachery ConchFriedman of [facility] and observation with 6 months f/u was recommended.  However, approximately 1-2 weeks ago, she developed more acute worsening of her headache, with accompanying dizziness and double vision.  She also developed some neck stiffness and worse back pain. She is not on blood thinners, but she is on an OCP patch.   Patient Active Problem List   Diagnosis Date Noted  . 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 Obstructive hydrocephalus (HCC) 03/21/2020   Past Medical History:  Diagnosis Date  . Anemia     Past Surgical History:  Procedure Laterality Date  . CESAREAN SECTION  2016  . DILATION AND EVACUATION N/A 03/06/2017   Procedure: DILATATION AND EVACUATION;  Surgeon: Tracey HarriesHenley, Jatoria Kneeland, MD;  Location: [facility];  Service: Gynecology;  Laterality: N/A;    (Not in a hospital admission)  Allergies  Allergen Reactions  . Flexeril [Cyclobenzaprine] Other (See Comments)    headache  . Gabapentin Other (See Comments)    Woozy, feeling drunk.    Social History   Tobacco Use  . Smoking status: Never Smoker  . Smokeless tobacco: Never Used  Substance Use Topics  . Alcohol use: No    Family History  Problem Relation Age of Onset  . Hypertension Mother   . Diabetes Father   . Diabetes Sister   . Diabetes Paternal Grandmother   . Hypertension Paternal Grandmother   . Migraines Neg Hx      Review of Systems Pertinent items noted in HPI and remainder of comprehensive ROS otherwise negative.  Objective:   Patient Vitals for the past 8 hrs:  BP Temp Temp src Pulse Resp SpO2  03/22/20 1400 113/71 -- -- 63 (!) 23 100 %  03/22/20  1345 137/75 -- -- 62 (!) 24 100 %  03/22/20 1300 132/73 -- -- (!) 58 20 100 %  03/22/20 1230 126/74 -- -- (!) 58 19 100 %  03/22/20 1223 123/74 -- -- 60 19 99 %  03/22/20 1140 (!) 152/99 97.9 F (36.6 C) Oral 88 (!) 24 99 %   No intake/output data recorded. No intake/output data recorded.      General : cooperative, no distress, appears stated age   Head:  Normocephalic/atraumatic    Eyes: PERRL, conjunctiva/corneas clear, EOM's intact. Fundi could not be visualized Neck: Supple Chest:  Respirations unlabored Chest wall: no tenderness or deformity Heart: Regular rate and rhythm Abdomen: Soft, nontender and nondistended Extremities: warm and well-perfused Skin: normal turgor, color and texture Neurologic:  Awake but drowsy, oriented x 3.  Eyes open spontaneously. PERRL, bilateral papilledema seen. Mild left 6th nerve palsy and mild upgaze palsy. VFC, no facial droop. V1-3 intact.  No dysarthria, tongue protrusion symmetric.  CNII-XII intact. Normal strength, sensation and reflexes throughout.  No pronator drift, full strength in legs       Data Review CBC:  Lab Results  Component Value Date   WBC 14.6 (H) 03/22/2020   RBC 4.76 03/22/2020   BMP:  Lab Results  Component Value Date   GLUCOSE 172 (H) 03/22/2020   CO2 19 (L) 03/22/2020  BUN 13 03/22/2020   CREATININE 0.84 03/22/2020   CALCIUM 9.4 03/22/2020   Coagulation:  Lab Results  Component Value Date   INR 1.0 03/08/2020   APTT 25 03/08/2020    CT head without contrast performed today and previous imaging reviewed.  Compared with previous scans, there is now small volume acute blood in the 3rd ventricle and temporal horns as well as in the quadrigeminal cistern.  The ventricles are still not enlarged though they are larger than Dec 23rd. The convexity sulci are more effaced now. The pineal mass/cyst appears smaller than previously.  Assessment:  40 yo F with a pineal region mass with associated  hemorrhage.   Plan:  -I had a long discussion with the patient in the emergency room. I explained that there are 2 distinct problems. She clearly has increased intracranial pressure with headaches, 6th nerve palsy, and papilledema, presumably from obstructive hydrocephalus, though there is little in the way of ventriculomegaly. Due to the small ventricle size, she is not a candidate for ETV. She will likely need an EVD initially, to ascertain whether her pressures will normalize over the next few days. Ultimately, it is likely she will need a shunt. Her second problem is with regards to her pineal region mass. Given that it appears to have decreased in size after leaking blood as well as the imaging characteristics, I suspect this is a pineal region cyst rather than a parenchymal mass. I explained that there are 3 options-observation, biopsy, or surgical resection. All 3 treatment options have significant risks: with observation, risk would be brisk growth or rebleeding; with biopsy, there is significant risk would be bleeding with subsequent neurologic deficit or death especially given it has demonstrated it has bled; and with surgical resection there would be significant risk of morbidity and mortality. I will obtain a MRI as well as CTA and CTV to better delineate the best course of action. If there is clearly a discrete parenchymal mass, a biopsy may be appropriate as germinomas and most nongerminomatous germ cell tumors display growth and respond to nonsurgical treatments. However, at this time, as the mass appears smaller and it does appear more like a pineal region cyst, I am leaning towards having her recover from her bleeding event and treating her increased intracranial pressure.

## 2020-03-22 NOTE — Anesthesia Procedure Notes (Signed)
Procedure Name: Intubation Date/Time: 03/22/2020 8:18 PM Performed by: Claudina Lick, CRNA Pre-anesthesia Checklist: Patient identified, Emergency Drugs available, Suction available, Patient being monitored and Timeout performed Patient Re-evaluated:Patient Re-evaluated prior to induction Oxygen Delivery Method: Circle system utilized Preoxygenation: Pre-oxygenation with 100% oxygen Induction Type: IV induction Laryngoscope Size: Miller and 2 Grade View: Grade I Tube type: Oral Tube size: 7.0 mm Number of attempts: 1 Airway Equipment and Method: Stylet Placement Confirmation: ETT inserted through vocal cords under direct vision,  positive ETCO2 and breath sounds checked- equal and bilateral Secured at: 21 cm Tube secured with: Tape Dental Injury: Teeth and Oropharynx as per pre-operative assessment

## 2020-03-22 NOTE — ED Provider Notes (Signed)
[facility] EMERGENCY DEPARTMENT Provider Note   CSN: [id number] Arrival date & time: 03/22/20  1123     History Chief Complaint  Patient presents with  . Loss of Vision    Melissa Cisneros is a 40 y.o. female.  HPI Patient presents after being seen by her neurologist yesterday.  Has known pineal gland tumor.  Has had worsening headache.  Worsening vision changes.  Seen by ophthalmology and bilateral papilledema.  Has had previous MRIs and CTs.  Most recent CT around 2 weeks ago may be showed some worsening size of her ventricles.  Has seen both Dr. Christella Noa and Dr. Tommi Rumps, who is at [facility].  Did have plan for MRI of her whole spine but with worsening symptoms came in here.  No difficulty walking.  Headaches and feels worse if he sits up.  No nausea or vomiting.  Feels better with her eyes closed.  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    Past Medical History:  Diagnosis Date  . Anemia     Patient Active Problem List   Diagnosis Date Noted  . 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 Obstructive hydrocephalus ([place]) 03/21/2020    Past Surgical History:  Procedure Laterality Date  . CESAREAN SECTION  2016  . DILATION AND EVACUATION N/A 03/06/2017   Procedure: DILATATION AND EVACUATION;  Surgeon: Newton Pigg, MD;  Location: [facility];  Service: Gynecology;  Laterality: N/A;     OB History   No obstetric history on file.     Family History  Problem Relation Age of Onset  . Hypertension Mother   . Diabetes Father   . Diabetes Sister   . Diabetes Paternal Grandmother   . Hypertension Paternal Grandmother   . Migraines Neg Hx     Social History   Tobacco Use  . Smoking status: Never Smoker  . Smokeless tobacco: Never Used  Vaping Use  . Vaping Use: Never used  Substance Use Topics  . Alcohol use: No  . Drug use: No    Home Medications Prior to Admission medications   Medication Sig  Start Date End Date Taking? Authorizing Provider  acetaZOLAMIDE (DIAMOX) 250 MG tablet Take 1 tablet (250 mg total) by mouth 2 (two) times daily. 03/21/20  Yes Melvenia Beam, MD  aspirin-acetaminophen-caffeine (EXCEDRIN MIGRAINE) [phone] MG tablet Take 1 tablet by mouth every 6 (six) hours as needed for headache.   Yes [provider]  butalbital-acetaminophen-caffeine (FIORICET) 50-325-40 MG tablet Take 1 tablet by mouth every 4 (four) hours as needed for pain.   Yes [provider]  gabapentin (NEURONTIN) 300 MG capsule Take 300 mg by mouth 2 (two) times daily. 02/27/20  Yes [provider]  ibuprofen (ADVIL) 800 MG tablet Take 800 mg by mouth every 8 (eight) hours as needed for headache. 01/03/15  Yes [provider]  ondansetron (ZOFRAN ODT) 4 MG disintegrating tablet Take 1 tablet (4 mg total) by mouth every 8 (eight) hours as needed for nausea or vomiting. 03/08/20  Yes Truddie Hidden, MD  Marilu Favre 150-35 MCG/24HR transdermal patch Place 1 patch onto the skin once a week. 02/24/20  Yes [provider]  butalbital-acetaminophen-caffeine (FIORICET) 50-325-40 MG tablet Take 1-2 tablets by mouth every 6 (six) hours as needed for headache. Patient not taking: Reported on 03/22/2020 03/08/20 03/08/21  Truddie Hidden, MD    Allergies    Doxycycline and Flexeril [cyclobenzaprine]  Review of Systems   Review of Systems  Constitutional: Positive for appetite change and fatigue. Negative for fever.  HENT: Negative for congestion.   Eyes: Positive for visual disturbance.  Cardiovascular: Negative for leg swelling.  Gastrointestinal: Negative for abdominal pain.  Genitourinary: Negative for difficulty urinating.  Musculoskeletal: Negative for back pain.  Skin: Negative for rash.  Neurological: Positive for headaches.  Psychiatric/Behavioral: Negative for confusion.    Physical Exam Updated Vital Signs BP 126/62   Pulse (!) 58   Temp 97.9  F (36.6 C) (Oral)   Resp 16   SpO2 100%   Physical Exam Vitals and nursing note reviewed.  HENT:     Head: Normocephalic and atraumatic.  Eyes:     Pupils: Pupils are equal, round, and reactive to light.     Comments: Difficulty with looking to the left with the left eye.  Cardiovascular:     Rate and Rhythm: Normal rate and regular rhythm.  Pulmonary:     Breath sounds: No wheezing or rhonchi.  Abdominal:     Tenderness: There is no abdominal tenderness.  Musculoskeletal:        General: No tenderness.  Skin:    General: Skin is warm.     Capillary Refill: Capillary refill takes less than 2 seconds.  Neurological:     Mental Status: She is alert.     Comments: Awake and answers questions but mildly slow to answer.  Left 6th nerve palsy.  Reportedly difficulty walking but was not ambulated in my physical.     ED Results / Procedures / Treatments   Labs (all labs ordered are listed, but only abnormal results are displayed) Labs Reviewed  CBC - Abnormal; Notable for the following components:      Result Value   WBC 14.6 (*)    All other components within normal limits  COMPREHENSIVE METABOLIC PANEL - Abnormal; Notable for the following components:   Sodium 133 (*)    CO2 19 (*)    Glucose, Bld 172 (*)    Albumin 3.4 (*)    All other components within normal limits  RESP PANEL BY RT-PCR (FLU A&B, COVID) ARPGX2  PROTIME-INR  PREGNANCY, URINE  TYPE AND SCREEN    EKG None  Radiology CT Angio Head W or Wo Contrast  Result Date: 03/22/2020 CLINICAL DATA:  Intracranial hemorrhage in the setting of a pineal mass. EXAM: CT ANGIOGRAPHY HEAD TECHNIQUE: Multidetector CT imaging of the head was performed using the standard protocol during bolus administration of intravenous contrast. Multiplanar CT image reconstructions and MIPs were obtained to evaluate the vascular anatomy. CONTRAST:  39mL OMNIPAQUE IOHEXOL 350 MG/ML SOLN COMPARISON:  Noncontrast head CT 03/22/2020.  Head  MRI 01/29/2020. FINDINGS: Anterior circulation: The internal carotid arteries are widely patent from skull base to carotid termini. 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 ACAs and MCAs are patent without evidence of a proximal branch occlusion or significant proximal stenosis. No aneurysm is identified. Posterior circulation: The visualized distal vertebral arteries are widely patent to the basilar with the right being dominant. Patent PICA, AICA, and SCA origins are seen bilaterally. The basilar artery is widely patent. There is a moderately large left posterior communicating artery. Both PCAs are patent without evidence of a significant proximal stenosis, however there is moderate multifocal narrowing of distal PCA branches bilaterally. No aneurysm is identified. Venous sinuses: The superior sagittal sinus, straight sinus, transverse sinuses, sigmoid sinuses,  and jugular bulbs are patent without evidence of thrombus or significant stenosis. The internal cerebral veins are patent and are draped over the superior aspect of the pineal region mass. Anatomic variants: Hypoplastic left A1. IMPRESSION: 1. No major intracranial arterial occlusion, significant proximal stenosis, or aneurysm. 2. Moderate multifocal narrowing of distal PCA branches bilaterally, query vasospasm from regional subarachnoid hemorrhage. 3. No evidence of dural venous sinus thrombosis. 4. Patent internal cerebral veins draped over the superior aspect of the pineal mass. Electronically Signed   By: Sebastian Ache M.D.   On: 03/22/2020 15:42   CT HEAD WO CONTRAST  Result Date: 03/22/2020 CLINICAL DATA:  40 year old female with a history of mental status changes and known pineal mass EXAM: CT HEAD WITHOUT CONTRAST TECHNIQUE: Contiguous axial images were obtained from the base of the skull through the vertex without intravenous contrast. COMPARISON:  CT 03/08/2020, MR 01/29/2020, CT  01/28/2020 FINDINGS: Brain: Acute hemorrhage within third ventricle extending through the right foramen of Monro into the anterior horn right lateral ventricle. Small volume acute hemorrhage within the right temporal horn of the lateral ventricle. Acute hemorrhage posterior to the known pineal mass. Pineal mass measures smaller today than the prior CT, now 13 mm in transverse dimension. Hemorrhage contributes to mass effect of the tectum, with apparent narrowing of the cerebral aqua duct. Bifrontal diameter measures 38 mm with evidence of early hydrocephalus compared to the prior CT scan. No intraparenchymal hemorrhage. Vascular: Unremarkable. Skull: No acute fracture.  No aggressive bone lesion identified. Sinuses/Orbits: Unremarkable appearance of the orbits. Mastoid air cells clear. No middle ear effusion. No significant sinus disease. 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 Extension of the hemorrhage adjacent to the pineal tumor involves third ventricle and the right lateral ventricle. These results were discussed by telephone at the time of interpretation on 03/22/2020 at 12:30 pm to Dr. Cathren Laine Electronically Signed   By: Gilmer Mor D.O.   On: 03/22/2020 12:30    Procedures Procedures (including critical care time)  Medications Ordered in ED Medications  0.9 %  sodium chloride infusion (has no administration in time range)  dexamethasone (DECADRON) injection 10 mg (10 mg Intravenous Given 03/22/20 1406)  iohexol (OMNIPAQUE) 350 MG/ML injection 75 mL (75 mLs Intravenous Contrast Given 03/22/20 1518)    ED Course  I have reviewed the triage vital signs and the nursing notes.  Pertinent labs & imaging results that were available during my care of the patient were reviewed by me and considered in my medical decision making (see chart for details).    MDM Rules/Calculators/A&P                           Patient with worsening mental status.  Decreased vision.  Difficulty walking.  Known pineal mass.  Has been seen by Dr. Neurosurgery both here and at [facility].  CT showed new intracranial hemorrhage.  Discussed with Dr. Maisie Fus.  Recommended CTA CTV MRI with and without contrast with brain lab protocol.  Patient difficulty walking.  Worsening vision.  I believe he will require admission to the hospital.  Discussed with patient and her companion. Care turned over to Dr. Denton Lank   CRITICAL CARE Performed by: Benjiman Core Total critical care time: 30 minutes Critical care time was exclusive of separately billable procedures and treating other patients. Critical care was necessary to treat or prevent  imminent or life-threatening deterioration. Critical care was time spent personally by me on the following activities: development of treatment plan with patient and/or surrogate as well as nursing, discussions with consultants, evaluation of patient's response to treatment, examination of patient, obtaining history from patient or surrogate, ordering and performing treatments and interventions, ordering and review of laboratory studies, ordering and review of radiographic studies, pulse oximetry and re-evaluation of patient's condition.  Final Clinical Impression(s) / ED Diagnoses Final diagnoses:  Mass of pineal region  Intraventricular hemorrhage [facility])    Rx / DC Orders ED Discharge Orders    None       Davonna Belling, MD 03/22/20 1554

## 2020-03-22 NOTE — Progress Notes (Signed)
Pt was given contrast at 1600. Per radiology protocol there is to be a delay of 8-12 hours before gadolinium can be given again. Ordering Dr. Eather Colas.

## 2020-03-22 NOTE — Transfer of Care (Signed)
Immediate Anesthesia Transfer of Care Note  Patient: Melissa Cisneros  Procedure(s) Performed: PLACEMENT OF EXTERNAL VENTRICULAR DRAIN (Right Head)  Patient Location: PACU  Anesthesia Type:General  Level of Consciousness: drowsy  Airway & Oxygen Therapy: Patient Spontanous Breathing  Post-op Assessment: Report given to RN and Post -op Vital signs reviewed and stable  Post vital signs: Reviewed and stable  Last Vitals:  Vitals Value Taken Time  BP 124/83 03/22/20 2115  Temp    Pulse 70 03/22/20 2123  Resp 21 03/22/20 2123  SpO2 99 % 03/22/20 2123  Vitals shown include unvalidated device data.  Last Pain:  Vitals:   03/22/20 1701  TempSrc: Oral         Complications: No complications documented.

## 2020-03-22 NOTE — Telephone Encounter (Signed)
I spoke with Dr Franky Macho on the phone and he is aware.

## 2020-03-22 NOTE — Telephone Encounter (Signed)
I spoke with patient earlier this morning and. Insisted she go to the ED. I understand why she doesn't want to go but advised her again of the seriousness of her condition. Her ex-husband was there who also is a physician, he agreed with going to the ED and he drove her. I also spoke with Dr. Franky Macho who has seen patient([facility]) and spoke to Dr. Santiago Glad nurse and explained the seriousness of her condition. Patient agreed to go to [facility] emergency room and appears she is there now.

## 2020-03-22 NOTE — ED Notes (Signed)
Pt transported to CT ?

## 2020-03-22 NOTE — ED Triage Notes (Signed)
Pt sent from dr Lucia Gaskins at [facility], sent for worsening symptoms related to recent diagnosis of pinieal gland tumor. Headaches have become more severe. Husband at bedside reports that patient endorsed vision changes on 12/30, was found to have papilledema and cranial nerve 6 palsy at neuro office.

## 2020-03-22 NOTE — Progress Notes (Signed)
MRI and CTA/CTV were reviewed.  The mass is indeed smaller than in Dec 23rd, but there is clearly an enhancing component apposed to the tectal plate and at the inferior surface of the mass.  The internal cerebral veins are displaced upward from the mass.  Will plan for EVD. Given the current lack of ICU beds and for sterility purposes, I am currently planning to perform this in the OR.  I discussed the procedure in detail with the patient and her ex-husband at the bedside.  Risks, benefits, alternatives, and expected convalescence was discussed.  Risks discussed included but were not limited to bleeding, pain, infection, seizure, stroke, scar, cerebrospinal fluid leak, neurologic deficit, coma and death.  I also discussed with her the possibility of blood transfusion during surgery and consent to transfuse blood products if necessary was also obtained.  All questions and concerns were answered and understanding and agreement with the plan was verbalized.

## 2020-03-23 ENCOUNTER — Encounter (HOSPITAL_COMMUNITY): Payer: Self-pay | Admitting: Neurosurgery

## 2020-03-23 ENCOUNTER — Inpatient Hospital Stay (HOSPITAL_COMMUNITY): Payer: 59

## 2020-03-23 LAB — CSF CELL COUNT WITH DIFFERENTIAL
Lymphs, CSF: 37 % — ABNORMAL LOW (ref 40–80)
RBC Count, CSF: 42000 /mm3 — ABNORMAL HIGH
Segmented Neutrophils-CSF: 63 % — ABNORMAL HIGH (ref 0–6)
WBC, CSF: 14 /mm3 (ref 0–5)

## 2020-03-23 LAB — TYPE AND SCREEN
ABO/RH(D): A POS
ABO/RH(D): A POS
Antibody Screen: NEGATIVE
Antibody Screen: NEGATIVE

## 2020-03-23 LAB — ALKALINE PHOSPHATASE: Alkaline Phosphatase: 94 U/L (ref 38–126)

## 2020-03-23 LAB — GLUCOSE, CSF: Glucose, CSF: 102 mg/dL — ABNORMAL HIGH (ref 40–70)

## 2020-03-23 LAB — HCG, QUANTITATIVE, PREGNANCY: hCG, Beta Chain, Quant, S: 1 m[IU]/mL (ref <5)

## 2020-03-23 LAB — PROTEIN, CSF: Total  Protein, CSF: 63 mg/dL — ABNORMAL HIGH (ref 15–45)

## 2020-03-23 LAB — CYTOLOGY - NON PAP

## 2020-03-23 MED ORDER — DOCUSATE SODIUM 100 MG PO CAPS: 100.0000 mg | ORAL_CAPSULE | Freq: Two times a day (BID) | ORAL | 0 refills | Status: AC

## 2020-03-23 MED ORDER — POLYETHYLENE GLYCOL 3350 17 G PO PACK: 17.0000 g | PACK | Freq: Every day | ORAL | 0 refills | Status: AC | PRN

## 2020-03-23 MED ORDER — PROMETHAZINE HCL 12.5 MG PO TABS
12.5000 mg | ORAL_TABLET | ORAL | 0 refills | Status: AC | PRN
Start: 2020-03-23 — End: ?

## 2020-03-23 MED ORDER — SODIUM CHLORIDE 0.9 % IV SOLN: INTRAVENOUS | Status: DC

## 2020-03-23 MED ORDER — GADOBUTROL 1 MMOL/ML IV SOLN
7.0000 mL | Freq: Once | INTRAVENOUS | Status: AC | PRN
  Administered 2020-03-23: 7 mL via INTRAVENOUS

## 2020-03-23 MED ORDER — LEVETIRACETAM 500 MG PO TABS: 500.0000 mg | ORAL_TABLET | Freq: Two times a day (BID) | ORAL | Status: AC

## 2020-03-23 MED ORDER — DEXAMETHASONE 4 MG PO TABS: 4.0000 mg | ORAL_TABLET | Freq: Three times a day (TID) | ORAL | Status: AC

## 2020-03-23 MED ORDER — PANTOPRAZOLE SODIUM 40 MG PO TBEC: 40.0000 mg | DELAYED_RELEASE_TABLET | Freq: Every day | ORAL | Status: AC

## 2020-03-23 MED ORDER — CHLORHEXIDINE GLUCONATE 0.12 % MT SOLN: 15.0000 mL | Freq: Once | OROMUCOSAL | Status: AC

## 2020-03-23 NOTE — Discharge Summary (Signed)
Physician Discharge Summary     Providing Compassionate, Quality Care - Together   Patient ID: Melissa Cisneros MRN: [id number] DOB/AGE: Nov 08, 1980 40 y.o.  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 Tommi Rumps of [place]. She was seen by Dr. Jaynee Eagles of Neurology on 03/22/2020 due to persistent headaches and diplopia. Dr. 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 Katy Fitch of Ophthalmology. Dr. Jaynee Eagles insisted the patient go to the emergency department to get a stat head CT. She presented to the [facility] ED, where imaging revealed the pineal tumor had recently bled. There was mild subarachnoid and intraventricular hemorrhage, with mild hydrocephalus. 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 An EVD was placed by Dr. Marcello Moores of Neurosurgery on 03/22/2020. The opening pressure was greater than 30 cm H2O, with blood-tinged CSF. The EVD was maintained at 10 cm H2O overnight. The patient's headache improved significantly following placement of the EVD, though she is still reporting blurry vision. As the patient's condition has stabilized, she would like to transfer to [facility] to continue her treatment with Dr. Tommi Rumps, with whom she has already established care. The patient is ready for transfer at this time and transportation has been arranged through [facility].  Consults: None  Significant Diagnostic Studies: CT Angio Head W or Wo Contrast  Result Date: 03/22/2020 CLINICAL DATA:  Intracranial hemorrhage in the setting of a pineal mass. EXAM: CT ANGIOGRAPHY HEAD TECHNIQUE: Multidetector CT imaging of the head was performed using the  standard protocol during bolus administration of intravenous contrast. Multiplanar CT image reconstructions and MIPs were obtained to evaluate the vascular anatomy. CONTRAST:  66mL OMNIPAQUE IOHEXOL 350 MG/ML SOLN COMPARISON:  Noncontrast head CT 03/22/2020.  Head MRI 01/29/2020. FINDINGS: Anterior circulation: The internal carotid arteries are widely patent from skull base to carotid termini. 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 ACAs and MCAs are patent without evidence of a proximal branch occlusion or significant proximal stenosis. No aneurysm is identified. Posterior circulation: The visualized distal vertebral arteries are widely patent to the basilar with the right being dominant. Patent PICA, AICA, and SCA origins are seen bilaterally. The basilar artery is widely patent. There is a moderately large left posterior communicating artery. Both PCAs are patent without evidence of a significant proximal stenosis, however there is moderate multifocal narrowing of distal PCA branches bilaterally. No aneurysm is identified. Venous sinuses: The superior sagittal sinus, straight sinus, transverse sinuses, sigmoid sinuses, and jugular bulbs are patent without evidence of thrombus or significant stenosis. The internal cerebral veins are patent and are draped over the superior aspect of the pineal region mass. Anatomic variants: Hypoplastic left A1. IMPRESSION: 1. No major intracranial arterial occlusion, significant proximal stenosis, or aneurysm. 2. Moderate multifocal narrowing of distal PCA branches bilaterally, query vasospasm from regional subarachnoid hemorrhage. 3. No evidence of dural venous sinus thrombosis. 4. Patent internal cerebral veins draped over the superior aspect of the pineal mass. Electronically Signed   By: Logan Bores M.D.   On: 03/22/2020 15:42   CT HEAD WO CONTRAST  Result Date: 03/22/2020 CLINICAL DATA:  40 year old female  with a  history of mental status changes and known pineal mass EXAM: CT HEAD WITHOUT CONTRAST TECHNIQUE: Contiguous axial images were obtained from the base of the skull through the vertex without intravenous contrast. COMPARISON:  CT 03/08/2020, MR 01/29/2020, CT 01/28/2020 FINDINGS: Brain: Acute hemorrhage within third ventricle extending through the right foramen of [place] into the anterior horn right lateral ventricle. Small volume acute hemorrhage within the right temporal horn of the lateral ventricle. Acute hemorrhage posterior to the known pineal mass. Pineal mass measures smaller today than the prior CT, now 13 mm in transverse dimension. Hemorrhage contributes to mass effect of the tectum, with apparent narrowing of the cerebral aqua duct. Bifrontal diameter measures 38 mm with evidence of early hydrocephalus compared to the prior CT scan. No intraparenchymal hemorrhage. Vascular: Unremarkable. Skull: No acute fracture.  No aggressive bone lesion identified. Sinuses/Orbits: Unremarkable appearance of the orbits. Mastoid air cells clear. No middle ear effusion. No significant sinus disease. 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 Extension of the hemorrhage adjacent to the pineal tumor involves third ventricle and the right lateral ventricle. These results were discussed by telephone at the time of interpretation on 03/22/2020 at 12:30 pm to Dr. Lajean Saver Electronically Signed   By: Corrie Mckusick D.O.   On: 03/22/2020 12:30   MR Brain W and Wo Contrast  Result Date: 03/22/2020 CLINICAL DATA:  Intracranial hemorrhage.  History of pineal tumor. EXAM: MRI HEAD WITHOUT AND WITH CONTRAST TECHNIQUE: Multiplanar, multiecho pulse sequences of the brain and surrounding structures were obtained without and with intravenous contrast. CONTRAST:  18mL GADAVIST GADOBUTROL 1 MMOL/ML IV SOLN COMPARISON:  CT head  03/22/2020.  MRI head with contrast 01/29/2020 FINDINGS: Brain: Hemorrhagic tumor in the pineal region. The mass appears larger compared to the prior MRI due to interval hemorrhage. The mass shows partial and irregular enhancement surrounding hematoma. Enhancing tumor measures approximately 21 x 21 mm on coronal images. There is mass-effect on the tectum. There is mild obstructive hydrocephalus unchanged from CT earlier today. Progressive ventricular size compared with the prior MRI. Fourth ventricle also mildly dilated. No midline shift. 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 There is subarachnoid and intraventricular hemorrhage. Vascular: Normal arterial flow voids. Skull and upper cervical spine: Negative Sinuses/Orbits: Mild mucosal edema paranasal sinuses negative orbit Other: None IMPRESSION: 1. Pineal tumor with recent hemorrhage. There is mild subarachnoid and intraventricular hemorrhage with mild hydrocephalus. The tumor exerts mass-effect on the tectum. 2. Acute infarct right medial thalamus. Electronically Signed   By: Franchot Gallo M.D.   On: 03/22/2020 16:30   MR Cervical Spine W or Wo Contrast  Result Date: 03/23/2020 CLINICAL DATA:  Initial evaluation for CNS neoplasm, staging. EXAM: MRI TOTAL SPINE WITHOUT AND WITH CONTRAST TECHNIQUE: Multisequence MR imaging of the spine from the cervical spine to the sacrum was performed prior to and following IV contrast administration for evaluation of spinal metastatic disease. CONTRAST:  71mL GADAVIST GADOBUTROL 1 MMOL/ML IV SOLN COMPARISON:  Comparison made with previous brain MRI from 03/22/2020. FINDINGS: MRI CERVICAL SPINE FINDINGS Alignment: Straightening of the normal cervical lordosis. No listhesis or static subluxation. Vertebrae: Vertebral body height well maintained without acute or chronic fracture. Bone marrow signal intensity normal. No discrete or worrisome osseous lesions. No abnormal marrow edema. Cord: Normal signal  and morphology. No abnormal enhancement to suggest metastatic disease. No evidence for subarachnoid seeding. Note made of  an extramedullary 4 mm nonenhancing nodular density at the right dorsolateral aspect of the thecal sac at the level of T1, best seen on sagittal STIR sequence (series 7, image 6). This demonstrates no associated enhancement, and appears to be associated with adjacent vascular structures, and is favored to be vascular in nature. Posterior Fossa, vertebral arteries, paraspinal tissues: Visualized portions of the brain better evaluated on prior brain MRI. Craniocervical junction within normal limits. Paraspinous and prevertebral soft tissues normal. Normal flow voids seen within the vertebral arteries bilaterally. Disc levels: No significant disc pathology seen within the cervical spine. Disc space height maintained. No disc bulge or focal disc herniation. No stenosis or impingement. MRI THORACIC SPINE FINDINGS Alignment: Physiologic with preservation of the normal thoracic kyphosis. No listhesis. Vertebrae: Vertebral body height well maintained without acute or chronic fracture. Bone marrow signal intensity within normal limits. Small benign hemangioma noted within the T8 vertebral body. No other discrete or worrisome osseous lesions. No abnormal marrow edema or enhancement. Cord: Normal signal and morphology. No evidence for metastatic disease or subarachnoid seeding. No abnormal enhancement. Paraspinal and other soft tissues: Paraspinous soft tissues within normal limits. Small layering bilateral pleural effusions with associated atelectasis. Visualized visceral structures otherwise unremarkable. Disc levels: No significant disc pathology seen within the thoracic spine. Intervertebral discs are well hydrated with preserved disc height. No disc bulge or focal disc herniation. No significant stenosis or impingement. MRI LUMBAR SPINE FINDINGS Segmentation: Standard. Lowest well-formed disc space  labeled the L5-S1 level. Alignment: Physiologic with preservation of the normal lumbar lordosis. No listhesis. Vertebrae: Vertebral body height maintained without acute or chronic fracture. Bone marrow signal intensity within normal limits. No discrete or worrisome osseous lesions. No abnormal marrow edema or enhancement. Conus medullaris: Extends to the L1 level and appears normal. No evidence for metastatic disease or subarachnoid seeding. No abnormal enhancement. Paraspinal and other soft tissues: Paraspinous soft tissues within normal limits. 5.6 cm unilocular simple cyst noted within the right adnexa (series 27, image 5). Bladder is moderately distended. Visualized visceral structures otherwise unremarkable. Disc levels: No significant disc pathology seen within the lumbar spine. Intervertebral discs are well hydrated with preserved disc height. No disc bulge or focal disc herniation. No significant stenosis or impingement. IMPRESSION: 1. No evidence for metastatic disease or subarachnoid seeding within the cervical, thoracic, or lumbar spine. 2. 5.6 cm simple cyst within the right adnexa, indeterminate, and incompletely assessed on this exam. Finding could be further assessed with dedicated pelvic ultrasound as warranted. 3. Small layering bilateral pleural effusions with associated atelectasis. Electronically Signed   By: Jeannine Boga M.D.   On: 03/23/2020 05:49   MR THORACIC SPINE W WO CONTRAST  Result Date: 03/23/2020 CLINICAL DATA:  Initial evaluation for CNS neoplasm, staging. EXAM: MRI TOTAL SPINE WITHOUT AND WITH CONTRAST TECHNIQUE: Multisequence MR imaging of the spine from the cervical spine to the sacrum was performed prior to and following IV contrast administration for evaluation of spinal metastatic disease. CONTRAST:  75mL GADAVIST GADOBUTROL 1 MMOL/ML IV SOLN COMPARISON:  Comparison made with previous brain MRI from 03/22/2020. FINDINGS: MRI CERVICAL SPINE FINDINGS Alignment:  Straightening of the normal cervical lordosis. No listhesis or static subluxation. Vertebrae: Vertebral body height well maintained without acute or chronic fracture. Bone marrow signal intensity normal. No discrete or worrisome osseous lesions. No abnormal marrow edema. Cord: Normal signal and morphology. No abnormal enhancement to suggest metastatic disease. No evidence for subarachnoid seeding. Note made of an extramedullary 4 mm nonenhancing nodular density  at the right dorsolateral aspect of the thecal sac at the level of T1, best seen on sagittal STIR sequence (series 7, image 6). This demonstrates no associated enhancement, and appears to be associated with adjacent vascular structures, and is favored to be vascular in nature. Posterior Fossa, vertebral arteries, paraspinal tissues: Visualized portions of the brain better evaluated on prior brain MRI. Craniocervical junction within normal limits. Paraspinous and prevertebral soft tissues normal. Normal flow voids seen within the vertebral arteries bilaterally. Disc levels: No significant disc pathology seen within the cervical spine. Disc space height maintained. No disc bulge or focal disc herniation. No stenosis or impingement. MRI THORACIC SPINE FINDINGS Alignment: Physiologic with preservation of the normal thoracic kyphosis. No listhesis. Vertebrae: Vertebral body height well maintained without acute or chronic fracture. Bone marrow signal intensity within normal limits. Small benign hemangioma noted within the T8 vertebral body. No other discrete or worrisome osseous lesions. No abnormal marrow edema or enhancement. Cord: Normal signal and morphology. No evidence for metastatic disease or subarachnoid seeding. No abnormal enhancement. Paraspinal and other soft tissues: Paraspinous soft tissues within normal limits. Small layering bilateral pleural effusions with associated atelectasis. Visualized visceral structures otherwise unremarkable. Disc levels:  No significant disc pathology seen within the thoracic spine. Intervertebral discs are well hydrated with preserved disc height. No disc bulge or focal disc herniation. No significant stenosis or impingement. MRI LUMBAR SPINE FINDINGS Segmentation: Standard. Lowest well-formed disc space labeled the L5-S1 level. Alignment: Physiologic with preservation of the normal lumbar lordosis. No listhesis. Vertebrae: Vertebral body height maintained without acute or chronic fracture. Bone marrow signal intensity within normal limits. No discrete or worrisome osseous lesions. No abnormal marrow edema or enhancement. Conus medullaris: Extends to the L1 level and appears normal. No evidence for metastatic disease or subarachnoid seeding. No abnormal enhancement. Paraspinal and other soft tissues: Paraspinous soft tissues within normal limits. 5.6 cm unilocular simple cyst noted within the right adnexa (series 27, image 5). Bladder is moderately distended. Visualized visceral structures otherwise unremarkable. Disc levels: No significant disc pathology seen within the lumbar spine. Intervertebral discs are well hydrated with preserved disc height. No disc bulge or focal disc herniation. No significant stenosis or impingement. IMPRESSION: 1. No evidence for metastatic disease or subarachnoid seeding within the cervical, thoracic, or lumbar spine. 2. 5.6 cm simple cyst within the right adnexa, indeterminate, and incompletely assessed on this exam. Finding could be further assessed with dedicated pelvic ultrasound as warranted. 3. Small layering bilateral pleural effusions with associated atelectasis. Electronically Signed   By: Jeannine Boga M.D.   On: 03/23/2020 05:49   MR Lumbar Spine W Wo Contrast  Result Date: 03/23/2020 CLINICAL DATA:  Initial evaluation for CNS neoplasm, staging. EXAM: MRI TOTAL SPINE WITHOUT AND WITH CONTRAST TECHNIQUE: Multisequence MR imaging of the spine from the cervical spine to the sacrum was  performed prior to and following IV contrast administration for evaluation of spinal metastatic disease. CONTRAST:  25mL GADAVIST GADOBUTROL 1 MMOL/ML IV SOLN COMPARISON:  Comparison made with previous brain MRI from 03/22/2020. FINDINGS: MRI CERVICAL SPINE FINDINGS Alignment: Straightening of the normal cervical lordosis. No listhesis or static subluxation. Vertebrae: Vertebral body height well maintained without acute or chronic fracture. Bone marrow signal intensity normal. No discrete or worrisome osseous lesions. No abnormal marrow edema. Cord: Normal signal and morphology. No abnormal enhancement to suggest metastatic disease. No evidence for subarachnoid seeding. Note made of an extramedullary 4 mm nonenhancing nodular density at the right dorsolateral aspect of the  thecal sac at the level of T1, best seen on sagittal STIR sequence (series 7, image 6). This demonstrates no associated enhancement, and appears to be associated with adjacent vascular structures, and is favored to be vascular in nature. Posterior Fossa, vertebral arteries, paraspinal tissues: Visualized portions of the brain better evaluated on prior brain MRI. Craniocervical junction within normal limits. Paraspinous and prevertebral soft tissues normal. Normal flow voids seen within the vertebral arteries bilaterally. Disc levels: No significant disc pathology seen within the cervical spine. Disc space height maintained. No disc bulge or focal disc herniation. No stenosis or impingement. MRI THORACIC SPINE FINDINGS Alignment: Physiologic with preservation of the normal thoracic kyphosis. No listhesis. Vertebrae: Vertebral body height well maintained without acute or chronic fracture. Bone marrow signal intensity within normal limits. Small benign hemangioma noted within the T8 vertebral body. No other discrete or worrisome osseous lesions. No abnormal marrow edema or enhancement. Cord: Normal signal and morphology. No evidence for metastatic  disease or subarachnoid seeding. No abnormal enhancement. Paraspinal and other soft tissues: Paraspinous soft tissues within normal limits. Small layering bilateral pleural effusions with associated atelectasis. Visualized visceral structures otherwise unremarkable. Disc levels: No significant disc pathology seen within the thoracic spine. Intervertebral discs are well hydrated with preserved disc height. No disc bulge or focal disc herniation. No significant stenosis or impingement. MRI LUMBAR SPINE FINDINGS Segmentation: Standard. Lowest well-formed disc space labeled the L5-S1 level. Alignment: Physiologic with preservation of the normal lumbar lordosis. No listhesis. Vertebrae: Vertebral body height maintained without acute or chronic fracture. Bone marrow signal intensity within normal limits. No discrete or worrisome osseous lesions. No abnormal marrow edema or enhancement. Conus medullaris: Extends to the L1 level and appears normal. No evidence for metastatic disease or subarachnoid seeding. No abnormal enhancement. Paraspinal and other soft tissues: Paraspinous soft tissues within normal limits. 5.6 cm unilocular simple cyst noted within the right adnexa (series 27, image 5). Bladder is moderately distended. Visualized visceral structures otherwise unremarkable. Disc levels: No significant disc pathology seen within the lumbar spine. Intervertebral discs are well hydrated with preserved disc height. No disc bulge or focal disc herniation. No significant stenosis or impingement. IMPRESSION: 1. No evidence for metastatic disease or subarachnoid seeding within the cervical, thoracic, or lumbar spine. 2. 5.6 cm simple cyst within the right adnexa, indeterminate, and incompletely assessed on this exam. Finding could be further assessed with dedicated pelvic ultrasound as warranted. 3. Small layering bilateral pleural effusions with associated atelectasis. Electronically Signed   By: Rise MuBenjamin  McClintock M.D.    On: 03/23/2020 05:49     Treatments: Surgery: Right frontal ventriculostomy  Discharge Exam: Blood pressure (!) 146/88, pulse (!) 112, temperature 97.8 F (36.6 C), temperature source Oral, resp. rate (!) 31, height 5' 5.98" (1.676 m), weight 77.6 kg, SpO2 99 %.   Per Dr. Maurine Ministerhomas's assessment and nursing report: Patient is awake and alert Ox3, pleasant FC x 4 PERRLA Left 6th nerve palsy improving.  Disposition: Discharge disposition: [facility] Not Defined        Allergies as of 03/23/2020      Reactions   Doxycycline Nausea And Vomiting   Flexeril [cyclobenzaprine] Other (See Comments)   headache      Medication List    STOP taking these medications   ibuprofen 800 MG tablet Commonly known as: ADVIL     TAKE these medications   acetaZOLAMIDE 250 MG tablet Commonly known as: DIAMOX Take 1 tablet (250 mg total) by mouth 2 (two)  times daily.   aspirin-acetaminophen-caffeine 250-250-65 MG tablet Commonly known as: EXCEDRIN MIGRAINE Take 1 tablet by mouth every 6 (six) hours as needed for headache.   butalbital-acetaminophen-caffeine 50-325-40 MG tablet Commonly known as: FIORICET Take 1 tablet by mouth every 4 (four) hours as needed for pain. What changed: Another medication with the same name was removed. Continue taking this medication, and follow the directions you see here.   dexamethasone 4 MG tablet Commonly known as: DECADRON Take 1 tablet (4 mg total) by mouth every 8 (eight) hours. Start taking on: March 24, 2020   docusate sodium 100 MG capsule Commonly known as: COLACE Take 1 capsule (100 mg total) by mouth 2 (two) times daily. Start taking on: March 24, 2020   gabapentin 300 MG capsule Commonly known as: NEURONTIN Take 300 mg by mouth 2 (two) times daily.   levETIRAcetam 500 MG tablet Commonly known as: KEPPRA Take 1 tablet (500 mg total) by mouth 2 (two) times daily. Start taking on: March 24, 2020   ondansetron  4 MG disintegrating tablet Commonly known as: Zofran ODT Take 1 tablet (4 mg total) by mouth every 8 (eight) hours as needed for nausea or vomiting.   pantoprazole 40 MG tablet Commonly known as: PROTONIX Take 1 tablet (40 mg total) by mouth daily. Start taking on: March 24, 2020   polyethylene glycol 17 g packet Commonly known as: MIRALAX / GLYCOLAX Take 17 g by mouth daily as needed for mild constipation.   promethazine 12.5 MG tablet Commonly known as: PHENERGAN Take 1-2 tablets (12.5-25 mg total) by mouth every 4 (four) hours as needed for refractory nausea / vomiting.   Xulane 150-35 MCG/24HR transdermal patch Generic drug: norelgestromin-ethinyl estradiol Place 1 patch onto the skin once a week.        Signed: Patricia Nettle 03/23/2020, 10:57 PM

## 2020-03-23 NOTE — Progress Notes (Signed)
Subjective: Patient reports her headaches are significantly better, though she still has blurry vision.  Objective: Vital signs in last 24 hours: Temp:  [97.1 F (36.2 C)-98.7 F (37.1 C)] 97.8 F (36.6 C) (01/07 0800) Pulse Rate:  [57-110] 110 (01/07 0800) Resp:  [11-24] 20 (01/07 0800) BP: (80-158)/(55-99) 134/79 (01/07 0800) SpO2:  [97 %-100 %] 99 % (01/07 0800) Arterial Line BP: (134-187)/(67-96) 135/77 (01/07 0800) Weight:  [77.6 kg] 77.6 kg (01/06 1925)  Intake/Output from previous day: 01/06 0701 - 01/07 0700 In: 1721.3 [I.V.:1671.3; IV Piggyback:50] Out: 2045 [Urine:1950; Drains:70; Blood:25] Intake/Output this shift: Total I/O In: 199.9 [I.V.:199.9] Out: 957 [Urine:950; Drains:7]  Awake, alert, Ox3.  Pleasant FC x 4 PERRL.  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 EXAM: CT ANGIOGRAPHY HEAD TECHNIQUE: Multidetector CT imaging of the head was performed using the standard protocol during bolus administration of intravenous contrast. Multiplanar CT image reconstructions and MIPs were obtained to evaluate the vascular anatomy. CONTRAST:  43mL OMNIPAQUE IOHEXOL 350 MG/ML SOLN COMPARISON:  Noncontrast head CT 03/22/2020.  Head MRI 01/29/2020. FINDINGS: Anterior circulation: The internal carotid arteries are widely patent from skull base to carotid termini. 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 ACAs and MCAs are patent without evidence of a proximal branch occlusion or significant proximal stenosis. No aneurysm is identified. Posterior circulation: The visualized  distal vertebral arteries are widely patent to the basilar with the right being dominant. Patent PICA, AICA, and SCA origins are seen bilaterally. The basilar artery is widely patent. There is a moderately large left posterior communicating artery. Both PCAs are patent without evidence of a significant proximal stenosis, however there is moderate multifocal narrowing of distal PCA branches bilaterally. No aneurysm is identified. Venous sinuses: The superior sagittal sinus, straight sinus, transverse sinuses, sigmoid sinuses, and jugular bulbs are patent without evidence of thrombus or significant stenosis. The internal cerebral veins are patent and are draped over the superior aspect of the pineal region mass. Anatomic variants: Hypoplastic left A1. IMPRESSION: 1. No major intracranial arterial occlusion, significant proximal stenosis, or aneurysm. 2. Moderate multifocal narrowing of distal PCA branches bilaterally, query vasospasm from regional subarachnoid hemorrhage. 3. No evidence of dural venous sinus thrombosis. 4. Patent internal cerebral veins draped over the superior aspect of the pineal mass. Electronically Signed   By: Logan Bores M.D.   On: 03/22/2020 15:42   CT HEAD WO CONTRAST  Result Date: 03/22/2020 CLINICAL DATA:  40 year old female with a history of mental status changes and known pineal mass EXAM: CT HEAD WITHOUT CONTRAST TECHNIQUE: Contiguous axial images were obtained from the base of the skull through the vertex without intravenous contrast. COMPARISON:  CT 03/08/2020, MR 01/29/2020, CT 01/28/2020 FINDINGS: Brain: Acute hemorrhage within third ventricle extending through the right foramen of Monro into the anterior horn right lateral ventricle. Small volume acute hemorrhage within the right temporal horn of the lateral ventricle. Acute hemorrhage posterior to the known pineal mass. Pineal mass measures smaller today  than the prior CT, now 13 mm in transverse dimension. Hemorrhage  contributes to mass effect of the tectum, with apparent narrowing of the cerebral aqua duct. Bifrontal diameter measures 38 mm with evidence of early hydrocephalus compared to the prior CT scan. No intraparenchymal hemorrhage. Vascular: Unremarkable. Skull: No acute fracture.  No aggressive bone lesion identified. Sinuses/Orbits: Unremarkable appearance of the orbits. Mastoid air cells clear. No middle ear effusion. No significant sinus disease. 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 Extension of the hemorrhage adjacent to the pineal tumor involves third ventricle and the right lateral ventricle. These results were discussed by telephone at the time of interpretation on 03/22/2020 at 12:30 pm to Dr. Lajean Saver Electronically Signed   By: Corrie Mckusick D.O.   On: 03/22/2020 12:30   MR Brain W and Wo Contrast  Result Date: 03/22/2020 CLINICAL DATA:  Intracranial hemorrhage.  History of pineal tumor. EXAM: MRI HEAD WITHOUT AND WITH CONTRAST TECHNIQUE: Multiplanar, multiecho pulse sequences of the brain and surrounding structures were obtained without and with intravenous contrast. CONTRAST:  83mL GADAVIST GADOBUTROL 1 MMOL/ML IV SOLN COMPARISON:  CT head 03/22/2020.  MRI head with contrast 01/29/2020 FINDINGS: Brain: Hemorrhagic tumor in the pineal region. The mass appears larger compared to the prior MRI due to interval hemorrhage. The mass shows partial and irregular enhancement surrounding hematoma. Enhancing tumor measures approximately 21 x 21 mm on coronal images. There is mass-effect on the tectum. There is mild obstructive hydrocephalus unchanged from CT earlier today. Progressive ventricular size compared with the prior MRI. Fourth ventricle also mildly dilated. No midline shift. 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 There is subarachnoid and intraventricular hemorrhage.  Vascular: Normal arterial flow voids. Skull and upper cervical spine: Negative Sinuses/Orbits: Mild mucosal edema paranasal sinuses negative orbit Other: None IMPRESSION: 1. Pineal tumor with recent hemorrhage. There is mild subarachnoid and intraventricular hemorrhage with mild hydrocephalus. The tumor exerts mass-effect on the tectum. 2. Acute infarct right medial thalamus. Electronically Signed   By: Franchot Gallo M.D.   On: 03/22/2020 16:30   MR Cervical Spine W or Wo Contrast  Result Date: 03/23/2020 CLINICAL DATA:  Initial evaluation for CNS neoplasm, staging. EXAM: MRI TOTAL SPINE WITHOUT AND WITH CONTRAST TECHNIQUE: Multisequence MR imaging of the spine from the cervical spine to the sacrum was performed prior to and following IV contrast administration for evaluation of spinal metastatic disease. CONTRAST:  34mL GADAVIST GADOBUTROL 1 MMOL/ML IV SOLN COMPARISON:  Comparison made with previous brain MRI from 03/22/2020. FINDINGS: MRI CERVICAL SPINE FINDINGS Alignment: Straightening of the normal cervical lordosis. No listhesis or static subluxation. Vertebrae: Vertebral body height well maintained without acute or chronic fracture. Bone marrow signal intensity normal. No discrete or worrisome osseous lesions. No abnormal marrow edema. Cord: Normal signal and morphology. No abnormal enhancement to suggest metastatic disease. No evidence for subarachnoid seeding. Note made of an extramedullary 4 mm nonenhancing nodular density at the right dorsolateral aspect of the thecal sac at the level of T1, best seen on sagittal STIR sequence (series 7, image 6). This demonstrates no associated enhancement, and appears to be associated with adjacent vascular structures, and is favored to be vascular in nature. Posterior Fossa, vertebral arteries, paraspinal tissues: Visualized portions of the brain better evaluated on prior brain MRI. Craniocervical junction within normal limits. Paraspinous and prevertebral soft  tissues normal. Normal flow voids seen within the vertebral arteries  bilaterally. Disc levels: No significant disc pathology seen within the cervical spine. Disc space height maintained. No disc bulge or focal disc herniation. No stenosis or impingement. MRI THORACIC SPINE FINDINGS Alignment: Physiologic with preservation of the normal thoracic kyphosis. No listhesis. Vertebrae: Vertebral body height well maintained without acute or chronic fracture. Bone marrow signal intensity within normal limits. Small benign hemangioma noted within the T8 vertebral body. No other discrete or worrisome osseous lesions. No abnormal marrow edema or enhancement. Cord: Normal signal and morphology. No evidence for metastatic disease or subarachnoid seeding. No abnormal enhancement. Paraspinal and other soft tissues: Paraspinous soft tissues within normal limits. Small layering bilateral pleural effusions with associated atelectasis. Visualized visceral structures otherwise unremarkable. Disc levels: No significant disc pathology seen within the thoracic spine. Intervertebral discs are well hydrated with preserved disc height. No disc bulge or focal disc herniation. No significant stenosis or impingement. MRI LUMBAR SPINE FINDINGS Segmentation: Standard. Lowest well-formed disc space labeled the L5-S1 level. Alignment: Physiologic with preservation of the normal lumbar lordosis. No listhesis. Vertebrae: Vertebral body height maintained without acute or chronic fracture. Bone marrow signal intensity within normal limits. No discrete or worrisome osseous lesions. No abnormal marrow edema or enhancement. Conus medullaris: Extends to the L1 level and appears normal. No evidence for metastatic disease or subarachnoid seeding. No abnormal enhancement. Paraspinal and other soft tissues: Paraspinous soft tissues within normal limits. 5.6 cm unilocular simple cyst noted within the right adnexa (series 27, image 5). Bladder is moderately  distended. Visualized visceral structures otherwise unremarkable. Disc levels: No significant disc pathology seen within the lumbar spine. Intervertebral discs are well hydrated with preserved disc height. No disc bulge or focal disc herniation. No significant stenosis or impingement. IMPRESSION: 1. No evidence for metastatic disease or subarachnoid seeding within the cervical, thoracic, or lumbar spine. 2. 5.6 cm simple cyst within the right adnexa, indeterminate, and incompletely assessed on this exam. Finding could be further assessed with dedicated pelvic ultrasound as warranted. 3. Small layering bilateral pleural effusions with associated atelectasis. Electronically Signed   By: Jeannine Boga M.D.   On: 03/23/2020 05:49   MR THORACIC SPINE W WO CONTRAST  Result Date: 03/23/2020 CLINICAL DATA:  Initial evaluation for CNS neoplasm, staging. EXAM: MRI TOTAL SPINE WITHOUT AND WITH CONTRAST TECHNIQUE: Multisequence MR imaging of the spine from the cervical spine to the sacrum was performed prior to and following IV contrast administration for evaluation of spinal metastatic disease. CONTRAST:  58mL GADAVIST GADOBUTROL 1 MMOL/ML IV SOLN COMPARISON:  Comparison made with previous brain MRI from 03/22/2020. FINDINGS: MRI CERVICAL SPINE FINDINGS Alignment: Straightening of the normal cervical lordosis. No listhesis or static subluxation. Vertebrae: Vertebral body height well maintained without acute or chronic fracture. Bone marrow signal intensity normal. No discrete or worrisome osseous lesions. No abnormal marrow edema. Cord: Normal signal and morphology. No abnormal enhancement to suggest metastatic disease. No evidence for subarachnoid seeding. Note made of an extramedullary 4 mm nonenhancing nodular density at the right dorsolateral aspect of the thecal sac at the level of T1, best seen on sagittal STIR sequence (series 7, image 6). This demonstrates no associated enhancement, and appears to be  associated with adjacent vascular structures, and is favored to be vascular in nature. Posterior Fossa, vertebral arteries, paraspinal tissues: Visualized portions of the brain better evaluated on prior brain MRI. Craniocervical junction within normal limits. Paraspinous and prevertebral soft tissues normal. Normal flow voids seen within the vertebral arteries bilaterally. Disc levels: No significant disc pathology  seen within the cervical spine. Disc space height maintained. No disc bulge or focal disc herniation. No stenosis or impingement. MRI THORACIC SPINE FINDINGS Alignment: Physiologic with preservation of the normal thoracic kyphosis. No listhesis. Vertebrae: Vertebral body height well maintained without acute or chronic fracture. Bone marrow signal intensity within normal limits. Small benign hemangioma noted within the T8 vertebral body. No other discrete or worrisome osseous lesions. No abnormal marrow edema or enhancement. Cord: Normal signal and morphology. No evidence for metastatic disease or subarachnoid seeding. No abnormal enhancement. Paraspinal and other soft tissues: Paraspinous soft tissues within normal limits. Small layering bilateral pleural effusions with associated atelectasis. Visualized visceral structures otherwise unremarkable. Disc levels: No significant disc pathology seen within the thoracic spine. Intervertebral discs are well hydrated with preserved disc height. No disc bulge or focal disc herniation. No significant stenosis or impingement. MRI LUMBAR SPINE FINDINGS Segmentation: Standard. Lowest well-formed disc space labeled the L5-S1 level. Alignment: Physiologic with preservation of the normal lumbar lordosis. No listhesis. Vertebrae: Vertebral body height maintained without acute or chronic fracture. Bone marrow signal intensity within normal limits. No discrete or worrisome osseous lesions. No abnormal marrow edema or enhancement. Conus medullaris: Extends to the L1 level  and appears normal. No evidence for metastatic disease or subarachnoid seeding. No abnormal enhancement. Paraspinal and other soft tissues: Paraspinous soft tissues within normal limits. 5.6 cm unilocular simple cyst noted within the right adnexa (series 27, image 5). Bladder is moderately distended. Visualized visceral structures otherwise unremarkable. Disc levels: No significant disc pathology seen within the lumbar spine. Intervertebral discs are well hydrated with preserved disc height. No disc bulge or focal disc herniation. No significant stenosis or impingement. IMPRESSION: 1. No evidence for metastatic disease or subarachnoid seeding within the cervical, thoracic, or lumbar spine. 2. 5.6 cm simple cyst within the right adnexa, indeterminate, and incompletely assessed on this exam. Finding could be further assessed with dedicated pelvic ultrasound as warranted. 3. Small layering bilateral pleural effusions with associated atelectasis. Electronically Signed   By: Jeannine Boga M.D.   On: 03/23/2020 05:49   MR Lumbar Spine W Wo Contrast  Result Date: 03/23/2020 CLINICAL DATA:  Initial evaluation for CNS neoplasm, staging. EXAM: MRI TOTAL SPINE WITHOUT AND WITH CONTRAST TECHNIQUE: Multisequence MR imaging of the spine from the cervical spine to the sacrum was performed prior to and following IV contrast administration for evaluation of spinal metastatic disease. CONTRAST:  28mL GADAVIST GADOBUTROL 1 MMOL/ML IV SOLN COMPARISON:  Comparison made with previous brain MRI from 03/22/2020. FINDINGS: MRI CERVICAL SPINE FINDINGS Alignment: Straightening of the normal cervical lordosis. No listhesis or static subluxation. Vertebrae: Vertebral body height well maintained without acute or chronic fracture. Bone marrow signal intensity normal. No discrete or worrisome osseous lesions. No abnormal marrow edema. Cord: Normal signal and morphology. No abnormal enhancement to suggest metastatic disease. No evidence  for subarachnoid seeding. Note made of an extramedullary 4 mm nonenhancing nodular density at the right dorsolateral aspect of the thecal sac at the level of T1, best seen on sagittal STIR sequence (series 7, image 6). This demonstrates no associated enhancement, and appears to be associated with adjacent vascular structures, and is favored to be vascular in nature. Posterior Fossa, vertebral arteries, paraspinal tissues: Visualized portions of the brain better evaluated on prior brain MRI. Craniocervical junction within normal limits. Paraspinous and prevertebral soft tissues normal. Normal flow voids seen within the vertebral arteries bilaterally. Disc levels: No significant disc pathology seen within the cervical spine. Disc space  height maintained. No disc bulge or focal disc herniation. No stenosis or impingement. MRI THORACIC SPINE FINDINGS Alignment: Physiologic with preservation of the normal thoracic kyphosis. No listhesis. Vertebrae: Vertebral body height well maintained without acute or chronic fracture. Bone marrow signal intensity within normal limits. Small benign hemangioma noted within the T8 vertebral body. No other discrete or worrisome osseous lesions. No abnormal marrow edema or enhancement. Cord: Normal signal and morphology. No evidence for metastatic disease or subarachnoid seeding. No abnormal enhancement. Paraspinal and other soft tissues: Paraspinous soft tissues within normal limits. Small layering bilateral pleural effusions with associated atelectasis. Visualized visceral structures otherwise unremarkable. Disc levels: No significant disc pathology seen within the thoracic spine. Intervertebral discs are well hydrated with preserved disc height. No disc bulge or focal disc herniation. No significant stenosis or impingement. MRI LUMBAR SPINE FINDINGS Segmentation: Standard. Lowest well-formed disc space labeled the L5-S1 level. Alignment: Physiologic with preservation of the normal lumbar  lordosis. No listhesis. Vertebrae: Vertebral body height maintained without acute or chronic fracture. Bone marrow signal intensity within normal limits. No discrete or worrisome osseous lesions. No abnormal marrow edema or enhancement. Conus medullaris: Extends to the L1 level and appears normal. No evidence for metastatic disease or subarachnoid seeding. No abnormal enhancement. Paraspinal and other soft tissues: Paraspinous soft tissues within normal limits. 5.6 cm unilocular simple cyst noted within the right adnexa (series 27, image 5). Bladder is moderately distended. Visualized visceral structures otherwise unremarkable. Disc levels: No significant disc pathology seen within the lumbar spine. Intervertebral discs are well hydrated with preserved disc height. No disc bulge or focal disc herniation. No significant stenosis or impingement. IMPRESSION: 1. No evidence for metastatic disease or subarachnoid seeding within the cervical, thoracic, or lumbar spine. 2. 5.6 cm simple cyst within the right adnexa, indeterminate, and incompletely assessed on this exam. Finding could be further assessed with dedicated pelvic ultrasound as warranted. 3. Small layering bilateral pleural effusions with associated atelectasis. Electronically Signed   By: Jeannine Boga M.D.   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  Will likely raise EVD to 20 on Sunday, clamp trial - f/u CSF results   Vallarie Mare 03/23/2020, 9:31 AM

## 2020-03-23 NOTE — Progress Notes (Signed)
Contacted neurosurgery with concern of BP being out of parameters. Patient has a line that is continuously reading 160-170. Labetalol given four times but BP still out of range. Per Dr.Thomas, BP reading should come from the cuff which reads 120s.  Hart Rochester, RN

## 2020-03-23 NOTE — Progress Notes (Signed)
Dr Christella Noa at bedside to flush EVD, Several large clots flushed from EVD tubing, drain now pulsatile with serosanguinous drainage.

## 2020-03-23 NOTE — Progress Notes (Signed)
Dr Marcello Moores paged regarding patient drain no longer draining. MD aware of no output over the last hour, states to give it a few hours and call back regarding status.

## 2020-03-23 NOTE — Progress Notes (Signed)
CRITICAL VALUE ALERT  Critical Value:  Spinal WBC 14  Date & Time Notied:  1/7 1130AM  Provider Notified: Marcello Moores, MD  Orders Received/Actions taken: None

## 2020-03-24 LAB — AFP TUMOR MARKER: AFP, Serum, Tumor Marker: 1.5 ng/mL (ref 0.0–8.3)

## 2020-03-24 LAB — BETA HCG QUANT (REF LAB): hCG Quant: <1 m[IU]/mL

## 2020-03-26 ENCOUNTER — Other Ambulatory Visit: Payer: 59

## 2020-03-26 LAB — PATHOLOGIST SMEAR REVIEW

## 2020-03-26 NOTE — Telephone Encounter (Signed)
patient was seen in the hospital

## 2020-03-29 LAB — CSF CULTURE W GRAM STAIN: Culture: NO GROWTH

## 2020-03-31 ENCOUNTER — Other Ambulatory Visit: Payer: 59

## 2020-04-11 ENCOUNTER — Telehealth: Payer: Self-pay | Admitting: Neurology

## 2020-04-11 NOTE — Telephone Encounter (Signed)
Pt called, Please give me a call regarding disability paperwork. Would like a call from the nurse.

## 2020-04-11 NOTE — Telephone Encounter (Signed)
Spoke with Dr Jaynee Eagles, disability is deferred to [facility]. I called the pt and reached her VM. I LVM (ok per DPR) advising patient of this. I left the office number for call back if she has any further questions.

## 2020-04-19 ENCOUNTER — Ambulatory Visit: Payer: 59 | Admitting: Neurology

## 2020-04-29 ENCOUNTER — Other Ambulatory Visit: Payer: Self-pay

## 2020-04-29 ENCOUNTER — Emergency Department (HOSPITAL_BASED_OUTPATIENT_CLINIC_OR_DEPARTMENT_OTHER): Payer: 59

## 2020-04-29 ENCOUNTER — Encounter (HOSPITAL_BASED_OUTPATIENT_CLINIC_OR_DEPARTMENT_OTHER): Payer: Self-pay | Admitting: Emergency Medicine

## 2020-04-29 ENCOUNTER — Emergency Department (HOSPITAL_BASED_OUTPATIENT_CLINIC_OR_DEPARTMENT_OTHER)
Admission: EM | Admit: 2020-04-29 | Discharge: 2020-04-29 | Disposition: A | Discharge status: Home / Self Care | Payer: 59 | Attending: Emergency Medicine | Admitting: Emergency Medicine

## 2020-04-29 DIAGNOSIS — Z9889 Other specified postprocedural states: Secondary | ICD-10-CM | POA: Insufficient documentation

## 2020-04-29 DIAGNOSIS — Z7901 Long term (current) use of anticoagulants: Secondary | ICD-10-CM | POA: Diagnosis not present

## 2020-04-29 DIAGNOSIS — E348 Other specified endocrine disorders: Secondary | ICD-10-CM | POA: Diagnosis not present

## 2020-04-29 DIAGNOSIS — Z85841 Personal history of malignant neoplasm of brain: Secondary | ICD-10-CM | POA: Insufficient documentation

## 2020-04-29 DIAGNOSIS — G8918 Other acute postprocedural pain: Secondary | ICD-10-CM | POA: Insufficient documentation

## 2020-04-29 DIAGNOSIS — R519 Headache, unspecified: Secondary | ICD-10-CM | POA: Diagnosis present

## 2020-04-29 DIAGNOSIS — Z86711 Personal history of pulmonary embolism: Secondary | ICD-10-CM | POA: Diagnosis not present

## 2020-04-29 DIAGNOSIS — U071 COVID-19: Secondary | ICD-10-CM | POA: Diagnosis not present

## 2020-04-29 DIAGNOSIS — R58 Hemorrhage, not elsewhere classified: Secondary | ICD-10-CM

## 2020-04-29 LAB — BASIC METABOLIC PANEL
Anion gap: 8 (ref 5–15)
BUN: 13 mg/dL (ref 6–20)
CO2: 27 mmol/L (ref 22–32)
Calcium: 8.9 mg/dL (ref 8.9–10.3)
Chloride: 102 mmol/L (ref 98–111)
Creatinine, Ser: 0.72 mg/dL (ref 0.44–1.00)
GFR, Estimated: >60 mL/min (ref >60)
Glucose, Bld: 115 mg/dL — ABNORMAL HIGH (ref 70–99)
Potassium: 4.3 mmol/L (ref 3.5–5.1)
Sodium: 137 mmol/L (ref 135–145)

## 2020-04-29 LAB — CBC WITH DIFFERENTIAL/PLATELET
Abs Immature Granulocytes: 0.05 10*3/uL (ref 0.00–0.07)
Basophils Absolute: 0 10*3/uL (ref 0.0–0.1)
Basophils Relative: 0 %
Eosinophils Absolute: 0.1 10*3/uL (ref 0.0–0.5)
Eosinophils Relative: 1 %
HCT: 34.8 % — ABNORMAL LOW (ref 36.0–46.0)
Hemoglobin: 10.7 g/dL — ABNORMAL LOW (ref 12.0–15.0)
Immature Granulocytes: 1 %
Lymphocytes Relative: 34 %
Lymphs Abs: 2.4 10*3/uL (ref 0.7–4.0)
MCH: 27.6 pg (ref 26.0–34.0)
MCHC: 30.7 g/dL (ref 30.0–36.0)
MCV: 89.9 fL (ref 80.0–100.0)
Monocytes Absolute: 0.6 10*3/uL (ref 0.1–1.0)
Monocytes Relative: 8 %
Neutro Abs: 3.9 10*3/uL (ref 1.7–7.7)
Neutrophils Relative %: 56 %
Platelets: 339 10*3/uL (ref 150–400)
RBC: 3.87 MIL/uL (ref 3.87–5.11)
RDW: 13.6 % (ref 11.5–15.5)
WBC: 6.9 10*3/uL (ref 4.0–10.5)
nRBC: 0 % (ref 0.0–0.2)

## 2020-04-29 LAB — PROTIME-INR
INR: 1 (ref 0.8–1.2)
Prothrombin Time: 12.3 seconds (ref 11.4–15.2)

## 2020-04-29 LAB — RESP PANEL BY RT-PCR (FLU A&B, COVID) ARPGX2
Influenza A by PCR: NEGATIVE
Influenza B by PCR: NEGATIVE
SARS Coronavirus 2 by RT PCR: POSITIVE — AB

## 2020-04-29 LAB — APTT: aPTT: 25 seconds (ref 24–36)

## 2020-04-29 MED ORDER — ACETAMINOPHEN 500 MG PO TABS: 1000.0000 mg | ORAL_TABLET | Freq: Once | ORAL | Status: DC

## 2020-04-29 MED ORDER — HYDROMORPHONE HCL 1 MG/ML IJ SOLN
0.5000 mg | Freq: Once | INTRAMUSCULAR | Status: AC
  Administered 2020-04-29: 0.5 mg via INTRAVENOUS
  Filled 2020-04-29: qty 1

## 2020-04-29 MED ORDER — BUTALBITAL-APAP-CAFFEINE 50-325-40 MG PO TABS: 1.0000 | ORAL_TABLET | Freq: Four times a day (QID) | ORAL | Status: DC | PRN

## 2020-04-29 NOTE — ED Provider Notes (Signed)
[facility] EMERGENCY DEPARTMENT Provider Note   CSN: [id number] Arrival date & time: 04/29/20  1428     History Chief Complaint  Patient presents with  . Headache    Melissa Cisneros is a 40 y.o. 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 They report recent brain surgery with excision of the pineal tumor. Subsequently while she was in the hospital she developed bilateral PE and was started on Eliquis. Pt denies fevers, chills, neck stiffness, rash, unilateral weakness or numbness, blurry vision, double vision, confusion, speech changes, nausea, vomiting, or any other associated symptoms.    ONCOLOGIC HISTORY  1. Stage IV metastatic melanoma to brain A. 01/29/20, pt had episode of lightheadness and had fall, reported nystagmus. She reported to [facility] ER. Pt had 6th cranial nerve palsy, blurred vision and HA B. 01/29/20, MRI brain: IVH and hemorrhage adjacent to pineal mass C. 03/08/20, 2nd episode of syncope after fall in bathroom, hit head, lost consciousness. Ct head revealed mild ventriculomegaly without obstruction.  D. 03/2020, saw neurologist who noted papilledema with 6th cranial nerve palsy confirmed with ophthalomology E. 03/23/20, pt had excruciating headache and seen at [facility]. MRI of brain revealed IVH and hemorrhage adjacent to pineal mass. MRI spine wnl. EVD placed for elevated ICPs and opened at 10 cm. Opening pressure was 40, CSF labs sent. HA improved, started on diamox, keppra bid, and decadron 4 mg tid.  F. 03/24/20, inpt transfer to [facility]. Pt having pain with eye movements.  G. 03/25/20 MRI brain: heterogeneous enhancing pineal gland mass has increased in size to 2.2 cm, previously 1.1 cm. Area of  abnormal T2 flair signal at medial right thalmus. Interval right frontal ventriculostomy with tip at suprasellar region. Interval decrease in size from 03/22/20. No hemorrhage. Intraventricular hemorrhage persistent and possibly slightly decreased. Right frontal burr hole.  H. 03/26/20, underwent a left paramedian suboccipital craniotomy : Path:[id number] Malignant Melanoma. HMB 45 positive; brain OZD:[id number] findings of gross total resection of pineal region mass; post-surgical ischemia involving the bilateral thalami as well as the cerebellar vermis I. 03/28/20, CSF Path: negative for malignancy J. 03/29/20, Ct c/a/p: Bilateral acute pulmonary embolism within segmental and subsegmental pulmonary arteries. No metastatic disease K. 03/29/20, Ct brain: small amount of post surgical pneumocephalus. No parenchymal hemorrhage or cerebral edema, mass. Right greater than left hypoattenuation within bilateral thalami.  L. 03/30/20, underwent third ventriculocisternostomy for obstructive hydrocephalus.  M. 03/30/20, IR inferior vena cava filter placement; doppler US bilateral LE: no evidence of DVT N. 04/02/20, discharged from hospital. On steroid taper. Asked to start eliquis 5 mg bid on 04/06/20. O. 04/05/20, Initial evaluation at [place]. 04/06/20 saw Dr. Marily Memos in Radiation oncology, plan for 6 weeks of radiation. Q. 04/24/20, initiated IMRT R. 04/26/20, discussion regarding pembrolizumab immunotherapy    The history is provided by the patient, the spouse and medical records.       Past Medical History:  Diagnosis Date  . Anemia     Patient Active Problem List   Diagnosis Date Noted  . 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  . Intraventricular hemorrhage ([place]) 03/22/2020  . 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  Obstructive hydrocephalus ([place]) 03/21/2020    Past Surgical History:  Procedure Laterality Date  . BRAIN  SURGERY    . CESAREAN SECTION  2016  . DILATION AND EVACUATION N/A 03/06/2017   Procedure: DILATATION AND EVACUATION;  Surgeon: Newton Pigg, MD;  Location: [facility];  Service: Gynecology;  Laterality: N/A;  . VENTRICULOSTOMY Right 03/22/2020   Procedure: PLACEMENT OF EXTERNAL VENTRICULAR DRAIN;  Surgeon: Vallarie Mare, MD;  Location: [facility];  Service: Neurosurgery;  Laterality: Right;     OB History   No obstetric history on file.     Family History  Problem Relation Age of Onset  . Hypertension Mother   . Diabetes Father   . Diabetes Sister   . Diabetes Paternal Grandmother   . Hypertension Paternal Grandmother   . Migraines Neg Hx     Social History   Tobacco Use  . Smoking status: Never Smoker  . Smokeless tobacco: Never Used  Vaping Use  . Vaping Use: Never used  Substance Use Topics  . Alcohol use: No  . Drug use: No    Home Medications Prior to Admission medications   Medication Sig Start Date End Date Taking? Authorizing Provider  acetaZOLAMIDE (DIAMOX) 250 MG tablet Take 1 tablet (250 mg total) by mouth 2 (two) times daily. 03/21/20   Melvenia Beam, MD  aspirin-acetaminophen-caffeine (EXCEDRIN MIGRAINE) [phone] MG tablet Take 1 tablet by mouth every 6 (six) hours as needed for headache.    [provider]  butalbital-acetaminophen-caffeine (FIORICET) 50-325-40 MG tablet Take 1 tablet by mouth every 4 (four) hours as needed for pain.    [provider]  dexamethasone (DECADRON) 4 MG tablet Take 1 tablet (4 mg total) by mouth every 8 (eight) hours. 03/24/20   Viona Gilmore D, NP  docusate sodium (COLACE) 100 MG capsule Take 1 capsule (100 mg total) by mouth 2 (two) times daily. 03/24/20   Viona Gilmore D, NP  gabapentin (NEURONTIN) 300 MG capsule Take 300 mg by mouth 2 (two) times daily. 02/27/20   [provider]  levETIRAcetam (KEPPRA) 500 MG tablet Take 1 tablet (500 mg total) by mouth 2 (two) times daily. 03/24/20   Viona Gilmore  D, NP  ondansetron (ZOFRAN ODT) 4 MG disintegrating tablet Take 1 tablet (4 mg total) by mouth every 8 (eight) hours as needed for nausea or vomiting. 03/08/20   Truddie Hidden, MD  pantoprazole (PROTONIX) 40 MG tablet Take 1 tablet (40 mg total) by mouth daily. 03/24/20   Viona Gilmore D, NP  polyethylene glycol (MIRALAX / GLYCOLAX) 17 g packet Take 17 g by mouth daily as needed for mild constipation. 03/23/20   Viona Gilmore D, NP  promethazine (PHENERGAN) 12.5 MG tablet Take 1-2 tablets (12.5-25 mg total) by mouth every 4 (four) hours as needed for refractory nausea / vomiting. 03/23/20   Patricia Nettle, NP  Marilu Favre 150-35 MCG/24HR transdermal patch Place 1 patch onto the skin once a week. 02/24/20   [provider]    Allergies    Doxycycline and Flexeril [cyclobenzaprine]  Review of Systems   Review of Systems  Constitutional: Negative for chills and fever.  Eyes: Negative for visual disturbance.  Gastrointestinal: Negative for nausea and vomiting.  Neurological: Positive for dizziness and headaches. Negative for syncope, weakness and numbness.  All other systems reviewed and are negative.   Physical Exam Updated Vital Signs BP 113/77 (BP Location: Right Arm)   Pulse (!) 103   Temp 98.2 F (36.8  C) (Oral)   Resp 20   Ht 5\' 6"  (1.676 m)   Wt 81.6 kg   LMP 03/29/2020   SpO2 98%   BMI 29.05 kg/m   Physical Exam Vitals and nursing note reviewed.  Constitutional:      Appearance: She is not ill-appearing or diaphoretic.  HENT:     Head: Normocephalic and atraumatic.  Eyes:     Extraocular Movements: Extraocular movements intact.     Left eye: Abnormal extraocular motion (CN VI palsy) present.     Conjunctiva/sclera: Conjunctivae normal.     Pupils: Pupils are equal, round, and reactive to light.  Neck:     Meningeal: Brudzinski's sign and Kernig's sign absent.  Cardiovascular:     Rate and Rhythm: Normal rate and regular rhythm.     Heart sounds: Normal  heart sounds.  Pulmonary:     Effort: Pulmonary effort is normal.     Breath sounds: Normal breath sounds. No rhonchi or rales.  Abdominal:     Palpations: Abdomen is soft.     Tenderness: There is no abdominal tenderness.  Musculoskeletal:     Cervical back: Neck supple.  Skin:    General: Skin is warm and dry.  Neurological:     Mental Status: She is alert.     Comments: Alert and oriented to self, place, time and event.   Speech is fluent, clear without dysarthria or dysphasia.   Strength 5/5 in upper/lower extremities  Sensation intact in upper/lower extremities  No pronator drift.  Normal finger-to-nose and feet tapping.  CN I not tested  CN II grossly intact visual fields bilaterally. Did not visualize posterior eye.   CN III, IV, VI PERRLA. CN VI palsy on left.  CN V Intact sensation to sharp and light touch to the face  CN VII facial movements symmetric  CN VIII not tested  CN IX, X no uvula deviation, symmetric rise of soft palate  CN XI 5/5 SCM and trapezius strength bilaterally  CN XII Midline tongue protrusion, symmetric L/R movements      ED Results / Procedures / Treatments   Labs (all labs ordered are listed, but only abnormal results are displayed) Labs Reviewed  RESP PANEL BY RT-PCR (FLU A&B, COVID) ARPGX2 - Abnormal; Notable for the following components:      Result Value   SARS Coronavirus 2 by RT PCR POSITIVE (*)    All other components within normal limits  CBC WITH DIFFERENTIAL/PLATELET - Abnormal; Notable for the following components:   Hemoglobin 10.7 (*)    HCT 34.8 (*)    All other components within normal limits  BASIC METABOLIC PANEL - Abnormal; Notable for the following components:   Glucose, Bld 115 (*)    All other components within normal limits  PROTIME-INR  APTT    EKG None  Radiology CT Head Wo Contrast  Result Date: 04/29/2020 CLINICAL DATA:  Provided history: Repeat CT scan 6 hours after initial; assess for further  bleeding. EXAM: CT HEAD WITHOUT CONTRAST TECHNIQUE: Contiguous axial images were obtained from the base of the skull through the vertex without intravenous contrast. COMPARISON:  Prior head CT examinations 04/29/2020 and earlier. Brain MRI 03/22/2020. FINDINGS: Brain: Cerebral volume is normal. As compared to the head CT performed earlier today at 3:13 p.m., there is unchanged hyperdensity in the pineal gland region likely reflecting small-volume acute/early subacute hemorrhage. This is at the site of a previously demonstrated pineal tumor. No evidence of obstructive hydrocephalus. No midline  shift. No demarcated cortical infarct. Vascular: No hyperdense vessel. Skull: Bilateral frontoparietal burr holes. Parietooccipital cranioplasty. No calvarial fracture or focal suspicious osseous lesion. Sinuses/Orbits: Visualized orbits show no acute finding. Mild paranasal sinus mucosal thickening at the imaged levels. IMPRESSION: No significant change from the head CT performed earlier today at 3:13 p.m. Unchanged hyperdensity in the pineal region, likely reflecting small-volume acute/early subacute hemorrhage. No evidence of obstructive hydrocephalus. Redemonstrated parietooccipital cranioplasty, presumably from recent pineal tumor resection. Correlate with the surgical history. Mild paranasal sinus mucosal thickening. Electronically Signed   By: Kellie Simmering DO   On: 04/29/2020 21:46   CT Head Wo Contrast  Result Date: 04/29/2020 CLINICAL DATA:  Pt w/ pineal gland tumor surgery 3 weeks ago; having headache that won't go away today; blurry vision; double vision; dizziness; pt had MRI Friday at [place]: CT HEAD WITHOUT CONTRAST TECHNIQUE: Contiguous axial images were obtained from the base of the skull through the vertex without intravenous contrast. COMPARISON:  03/22/2020 FINDINGS: Brain: There is hyperattenuating hemorrhage in the expected location of the pineal gland. Pineal mass has been removed. Acute hemorrhage  was present posterior to the pineal gland on the CT dated 03/22/2020. Current hemorrhage spans approximately 1.3 x 0.8 x 1.1 cm. The ventricles are normal in size and configuration. No hydrocephalus. There are no parenchymal masses or mass effect. No parenchymal hemorrhage or evidence of an infarct. No other extra-axial abnormalities. Vascular: No hyperdense vessel or unexpected calcification. Skull: Occipital craniotomy flap. No bone lesion. Burr holes noted along the right and left frontal bones. Sinuses/Orbits: Visualized globes and orbits are unremarkable. The visualized sinuses are clear. Other: None. IMPRESSION: 1. Hyperattenuating hemorrhage noted in the expected location of the pineal gland. This is presumably postoperative. It is more dense than expected for the residual hemorrhage noted on 03/22/2020. 2. Status post occipital craniotomy with resection of a pineal mass. 3. No other abnormalities. No parenchymal hemorrhage or evidence of an infarct. Electronically Signed   By: Lajean Manes M.D.   On: 04/29/2020 15:25    Procedures Procedures   Medications Ordered in ED Medications  acetaminophen (TYLENOL) tablet 1,000 mg (0 mg Oral Hold 04/29/20 1915)  HYDROmorphone (DILAUDID) injection 0.5 mg (0.5 mg Intravenous Given 04/29/20 2023)    ED Course  I have reviewed the triage vital signs and the nursing notes.  Pertinent labs & imaging results that were available during my care of the patient were reviewed by me and considered in my medical decision making (see chart for details).    MDM Rules/Calculators/A&P                          40 year old female presenting to the ED today with complaint of worsening occipital HA, dizziness, and worsening CN 6 palsy that began this morning upon waking up at 8 AM. She reports history of recent craniectomy with excision of tumor (1/10) and third ventriculocisternostomy  (1/14) at [facility] by Dr. Tommi Rumps. Subsequently developed PE during that time and on  Eliquis. On arrival to the ED VSS. Pt appears to be in NAD. She is following commands without difficulty and alert and oriented x 4. Pt does have noted CN 6 palsy on left side. Will plan for basic labs and CT scan at this time. Attending physician Dr. Johnney Killian has evaluated patient and agrees with plan.   CT scan with hemorrhage near where the pineal gland was located prior to procedure. Will consult neurosurgery at  this time.   Discussed case with neurosurgeon Dr. Glenford Peers who evaluated patient's images - recommends consulting [facility] neurosurgery given it appears to be post op complication at this time.   Discussed case with Dr. Tommi Rumps at [facility]. Repeat CT scan 6 hours. If bleed is worse than she will need to be admitted at [facility]. If stable can be discharged home and follow up with him in the clinic at 8 AM Tuesday.   Repeat CT scan essentially unchanged from previous. On reevaluation pt resting comfortably; she has gotten relief of her headache from dilaudid. Per her physician at [facility] recommendations will discharge home with outpatient follow up. Pt in agreement with plan and stable for discharge home.   This note was prepared using Dragon voice recognition software and may include unintentional dictation errors due to the inherent limitations of voice recognition software.  Final Clinical Impression(s) / ED Diagnoses Final diagnoses:  Post-op pain  Hemorrhage    Rx / DC Orders ED Discharge Orders    None       Discharge Instructions     Please follow up with Dr. Tommi Rumps at the clinic at 8 AM Tuesday. I would recommend calling on Monday to confirm appointment time.        Eustaquio Maize, PA-C 04/29/20 2226    Charlesetta Shanks, MD 05/07/20 [phone]

## 2020-04-29 NOTE — Discharge Instructions (Addendum)
Please follow up with Dr. Tommi Rumps at the clinic at 8 AM Tuesday. I would recommend calling on Monday to confirm appointment time.

## 2020-04-29 NOTE — ED Notes (Signed)
Her mom is here and has given her some homemade food which she is eating without difficulty.

## 2020-04-29 NOTE — ED Provider Notes (Signed)
I provided a substantive portion of the care of this patient.  I personally performed the entirety of the medical decision making for this encounter.    Patient had complex medical history of stage IV metastatic melanoma to the brain.  She had a sudden worsening headache this morning with dizziness.  Patient was recently started on Eliquis due to bilateral PEs.  Patient is alert with clear mental status.  CT has identified some increased hemorrhage in the region of the pineal gland.  Plan will be for transfer to [facility] to her neurosurgery team.   Charlesetta Shanks, MD 04/29/20 [phone]

## 2020-04-29 NOTE — ED Triage Notes (Signed)
Pt had brain surgery 3 weeks ago. Reports she woke up with a headache this morning with dizziness. Pt takes Eliquis. Denies head injury.

## 2020-04-30 ENCOUNTER — Telehealth: Payer: Self-pay

## 2020-04-30 NOTE — Telephone Encounter (Signed)
Called to discuss with patient about COVID-19 symptoms and the use of one of the available treatments for those with mild to moderate Covid symptoms and at a high risk of hospitalization.  Pt appears to qualify for outpatient treatment due to co-morbid conditions and/or a member of an at-risk group in accordance with the FDA Emergency Use Authorization.    Symptom onset: Reports no symptoms Vaccinated: Yes Booster? No Immunocompromised? Yes Qualifiers: Cancer diagnosis  Pt. Reports she is asymptomatic.  Marcello Moores

## 2020-05-07 ENCOUNTER — Emergency Department (HOSPITAL_COMMUNITY): Payer: 59

## 2020-05-07 ENCOUNTER — Emergency Department (HOSPITAL_COMMUNITY)
Admission: EM | Admit: 2020-05-07 | Discharge: 2020-05-07 | Disposition: A | Discharge status: Other Acute Inpatient Hospital | Payer: 59 | Attending: Emergency Medicine | Admitting: Emergency Medicine

## 2020-05-07 ENCOUNTER — Other Ambulatory Visit: Payer: Self-pay

## 2020-05-07 ENCOUNTER — Encounter (HOSPITAL_COMMUNITY): Payer: Self-pay

## 2020-05-07 DIAGNOSIS — Z85841 Personal history of malignant neoplasm of brain: Secondary | ICD-10-CM | POA: Insufficient documentation

## 2020-05-07 DIAGNOSIS — Z7901 Long term (current) use of anticoagulants: Secondary | ICD-10-CM | POA: Diagnosis not present

## 2020-05-07 DIAGNOSIS — Z20822 Contact with and (suspected) exposure to covid-19: Secondary | ICD-10-CM | POA: Insufficient documentation

## 2020-05-07 DIAGNOSIS — R519 Headache, unspecified: Secondary | ICD-10-CM | POA: Diagnosis present

## 2020-05-07 DIAGNOSIS — D354 Benign neoplasm of pineal gland: Secondary | ICD-10-CM

## 2020-05-07 DIAGNOSIS — I629 Nontraumatic intracranial hemorrhage, unspecified: Secondary | ICD-10-CM | POA: Diagnosis not present

## 2020-05-07 HISTORY — DX: Neoplasm of unspecified behavior of brain: D49.6

## 2020-05-07 LAB — CBC WITH DIFFERENTIAL/PLATELET
Abs Immature Granulocytes: 0.1 10*3/uL — ABNORMAL HIGH (ref 0.00–0.07)
Basophils Absolute: 0 10*3/uL (ref 0.0–0.1)
Basophils Relative: 0 %
Eosinophils Absolute: 0 10*3/uL (ref 0.0–0.5)
Eosinophils Relative: 0 %
HCT: 30.5 % — ABNORMAL LOW (ref 36.0–46.0)
Hemoglobin: 9.4 g/dL — ABNORMAL LOW (ref 12.0–15.0)
Immature Granulocytes: 1 %
Lymphocytes Relative: 11 %
Lymphs Abs: 1.3 10*3/uL (ref 0.7–4.0)
MCH: 27.5 pg (ref 26.0–34.0)
MCHC: 30.8 g/dL (ref 30.0–36.0)
MCV: 89.2 fL (ref 80.0–100.0)
Monocytes Absolute: 0.7 10*3/uL (ref 0.1–1.0)
Monocytes Relative: 6 %
Neutro Abs: 10.5 10*3/uL — ABNORMAL HIGH (ref 1.7–7.7)
Neutrophils Relative %: 82 %
Platelets: 237 10*3/uL (ref 150–400)
RBC: 3.42 MIL/uL — ABNORMAL LOW (ref 3.87–5.11)
RDW: 13.7 % (ref 11.5–15.5)
WBC: 12.6 10*3/uL — ABNORMAL HIGH (ref 4.0–10.5)
nRBC: 0 % (ref 0.0–0.2)

## 2020-05-07 LAB — BASIC METABOLIC PANEL
Anion gap: 11 (ref 5–15)
BUN: 14 mg/dL (ref 6–20)
CO2: 23 mmol/L (ref 22–32)
Calcium: 8.6 mg/dL — ABNORMAL LOW (ref 8.9–10.3)
Chloride: 93 mmol/L — ABNORMAL LOW (ref 98–111)
Creatinine, Ser: 0.65 mg/dL (ref 0.44–1.00)
GFR, Estimated: >60 mL/min (ref >60)
Glucose, Bld: 155 mg/dL — ABNORMAL HIGH (ref 70–99)
Potassium: 3.8 mmol/L (ref 3.5–5.1)
Sodium: 127 mmol/L — ABNORMAL LOW (ref 135–145)

## 2020-05-07 LAB — I-STAT BETA HCG BLOOD, ED (MC, WL, AP ONLY): I-stat hCG, quantitative: <5 m[IU]/mL (ref <5)

## 2020-05-07 LAB — I-STAT CHEM 8, ED
BUN: 16 mg/dL (ref 6–20)
Calcium, Ion: 1.11 mmol/L — ABNORMAL LOW (ref 1.15–1.40)
Chloride: 91 mmol/L — ABNORMAL LOW (ref 98–111)
Creatinine, Ser: 0.5 mg/dL (ref 0.44–1.00)
Glucose, Bld: 151 mg/dL — ABNORMAL HIGH (ref 70–99)
HCT: 32 % — ABNORMAL LOW (ref 36.0–46.0)
Hemoglobin: 10.9 g/dL — ABNORMAL LOW (ref 12.0–15.0)
Potassium: 3.8 mmol/L (ref 3.5–5.1)
Sodium: 128 mmol/L — ABNORMAL LOW (ref 135–145)
TCO2: 27 mmol/L (ref 22–32)

## 2020-05-07 LAB — RESP PANEL BY RT-PCR (FLU A&B, COVID) ARPGX2
Influenza A by PCR: NEGATIVE
Influenza B by PCR: NEGATIVE
SARS Coronavirus 2 by RT PCR: NEGATIVE

## 2020-05-07 MED ORDER — SODIUM CHLORIDE 0.9 % IV SOLN: Freq: Once | INTRAVENOUS | Status: DC

## 2020-05-07 MED ORDER — FENTANYL CITRATE (PF) 100 MCG/2ML IJ SOLN
100.0000 ug | Freq: Once | INTRAMUSCULAR | Status: AC
Start: 2020-05-07 — End: 2020-05-07
  Administered 2020-05-07: 100 ug via INTRAVENOUS
  Filled 2020-05-07: qty 2

## 2020-05-07 MED ORDER — GADOBUTROL 1 MMOL/ML IV SOLN
8.0000 mL | Freq: Once | INTRAVENOUS | Status: AC | PRN
  Administered 2020-05-07: 8 mL via INTRAVENOUS

## 2020-05-07 MED ORDER — ONDANSETRON HCL 4 MG/2ML IJ SOLN
4.0000 mg | Freq: Once | INTRAMUSCULAR | Status: AC
  Administered 2020-05-07: 4 mg via INTRAVENOUS
  Filled 2020-05-07: qty 2

## 2020-05-07 NOTE — ED Triage Notes (Signed)
Pt arrives via [facility] from home for AMS. Pt has brain tumor and had surgery on it 03/30/2020. Pt LKW 1200 today. Per EMS pt is having short term memory loss. Per EMS pt has a hard time looking up, but pt told them it's been that way since the surg in Jan. Pt has balance issues and it's been that way since being dx w/tumor. Pt is A&Ox2 to self and situation. Pt is NSR on monitor. VSS.

## 2020-05-07 NOTE — ED Notes (Signed)
Transfer center at [facility] called [phone]

## 2020-05-07 NOTE — ED Notes (Signed)
Called [facility] re: doctor/doctor phone conference; [facility] did not receive CT and other scans via "push", only MRI. Transferred call to Doctor.

## 2020-05-07 NOTE — ED Notes (Signed)
Patient transported to MRI 

## 2020-05-07 NOTE — ED Provider Notes (Signed)
  Physical Exam  BP 134/77   Pulse (!) 52   Temp 98.9 F (37.2 C) (Oral)   Resp 14   Ht 5\' 6"  (1.676 m)   Wt 81.6 kg   SpO2 100%   BMI 29.04 kg/m   Physical Exam  ED Course/Procedures   Clinical Course as of 05/07/20 2051  Mon May 07, 2020  1603 I received a call from radiologist reading her head scan, which shows several areas of intracranial bleeding.  He suspects that there may be a central vein thrombosis, contributing to the multiple areas of bleeding.  Also considered are bleeding into stroke of the left thalamus. [EW]  1615 Case discussed with Dr. Christella Noa, neurosurgeon.  He recommends that the patient be managed by her care provider at [facility]. 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  Her husband is interested in having [place] order the MRV here and proceeding with consultation, with neurosurgery at [facility]. [EW]  1626 Dr. Darl Householder was assuming care and will carry on with the current plan. [EW]    Clinical Course User Index [EW] Daleen Bo, MD    Procedures  MDM  Care assumed at 4 PM.  Patient and is on Eliquis for recent PE and DVTs.  Unfortunately she also has pineal tumor that was resected at [facility] beginning of January.  Her CT showed 3 areas of hemorrhage.  Dr. Eulis Foster discussed with Dr. Christella Noa from neurosurgery, who recommend transfer to [facility].   5 pm  I talked to the [facility] transfer center.  They request prior sharing images.  There is a suggestion for possible venous tumor thrombosis MRI with and without contrast and MRV ordered  8:53 PM MRI did not show any venous dural thrombosis and the hemorrhage is stable. Patient still has intermittent confusion.  Her mental status has not declined while she was in the ED.  I discussed case with Dr. Martinique Komisarow from neurosurgery.  He accepted the patient to be transferred to the [facility] ED.   CRITICAL CARE Performed by: Wandra Arthurs   Total  critical care time: 40 minutes  Critical care time was exclusive of separately billable procedures and treating other patients.  Critical care was necessary to treat or prevent imminent or life-threatening deterioration.  Critical care was time spent personally by me on the following activities: development of treatment plan with patient and/or surrogate as well as nursing, discussions with consultants, evaluation of patient's response to treatment, examination of patient, obtaining history from patient or surrogate, ordering and performing treatments and interventions, ordering and review of laboratory studies, ordering and review of radiographic studies, pulse oximetry and re-evaluation of patient's condition.         Drenda Freeze, MD 05/07/20 [phone]

## 2020-05-07 NOTE — ED Notes (Signed)
Patient transported to CT 

## 2020-05-07 NOTE — ED Notes (Signed)
Spoke to [place] at MRI and request report "pushed" to [facility].

## 2020-05-07 NOTE — ED Notes (Signed)
Arranged transport by [facility] to [facility] ER./Dr. Martinique Komisarow, per Dr. Darl Householder instructions.

## 2020-05-07 NOTE — ED Notes (Signed)
[facility] neurosurg pgd 1619

## 2020-05-07 NOTE — ED Provider Notes (Signed)
[facility] EMERGENCY DEPARTMENT Provider Note   CSN: [id number] Arrival date & time: 05/07/20  1417     History Chief Complaint  Patient presents with  . confusion/ brain tumor  . Altered Mental Status    Melissa Cisneros is a 40 y.o. female.  HPI She presents for evaluation of headache which started today and is recurrent.  She was evaluated for same last week and had a CT scan without show progressive bleeding.  She is being followed by neurosurgery, at [facility].  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  She denies fever, chills, cough or shortness of breath.  There are no other known active modifying factors.    Past Medical History:  Diagnosis Date  . Anemia   . Brain tumor [facility])     Patient Active Problem List   Diagnosis Date Noted  . 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  . Intraventricular hemorrhage ([place]) 03/22/2020  . 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 Obstructive hydrocephalus ([place]) 03/21/2020    Past Surgical History:  Procedure Laterality Date  . BRAIN SURGERY    . CESAREAN SECTION  2016  . DILATION AND EVACUATION N/A 03/06/2017   Procedure: DILATATION AND EVACUATION;  Surgeon: Newton Pigg, MD;  Location: [facility];  Service: Gynecology;  Laterality: N/A;  . VENTRICULOSTOMY Right 03/22/2020   Procedure: PLACEMENT OF EXTERNAL VENTRICULAR DRAIN;  Surgeon: Vallarie Mare, MD;  Location: [facility];  Service: Neurosurgery;  Laterality: Right;     OB History   No obstetric history on file.     Family History  Problem Relation Age of Onset  . Hypertension Mother   . Diabetes Father   . Diabetes Sister   . Diabetes Paternal Grandmother   . Hypertension Paternal Grandmother   . Migraines Neg Hx     Social History   Tobacco Use  . Smoking status: Never Smoker  . Smokeless tobacco: Never Used  Vaping Use  .  Vaping Use: Never used  Substance Use Topics  . Alcohol use: No  . Drug use: No    Home Medications Prior to Admission medications   Medication Sig Start Date End Date Taking? Authorizing Provider  ELIQUIS 5 MG TABS tablet Take 5 mg by mouth 2 (two) times daily. 04/02/20  Yes [provider]  gabapentin (NEURONTIN) 300 MG capsule Take 300-600 mg by mouth See admin instructions. 02/27/20  Yes [provider]  promethazine (PHENERGAN) 12.5 MG tablet Take 1-2 tablets (12.5-25 mg total) by mouth every 4 (four) hours as needed for refractory nausea / vomiting. 03/23/20  Yes Viona Gilmore D, NP  acetaZOLAMIDE (DIAMOX) 250 MG tablet Take 1 tablet (250 mg total) by mouth 2 (two) times daily. 03/21/20   Melvenia Beam, MD  dexamethasone (DECADRON) 4 MG tablet Take 1 tablet (4 mg total) by mouth every 8 (eight) hours. 03/24/20   Viona Gilmore D, NP  docusate sodium (COLACE) 100 MG capsule Take 1 capsule (100 mg total) by mouth 2 (two) times daily. 03/24/20   Viona Gilmore D, NP  levETIRAcetam (KEPPRA) 500 MG tablet Take 1 tablet (500 mg total) by mouth 2 (two) times daily. 03/24/20   Viona Gilmore D, NP  memantine (NAMENDA) 10 MG tablet Take 10 mg by mouth 2 (two) times daily. 05/02/20   [provider]  ondansetron (ZOFRAN ODT) 4  MG disintegrating tablet Take 1 tablet (4 mg total) by mouth every 8 (eight) hours as needed for nausea or vomiting. 03/08/20   Truddie Hidden, MD  pantoprazole (PROTONIX) 40 MG tablet Take 1 tablet (40 mg total) by mouth daily. 03/24/20   Viona Gilmore D, NP  polyethylene glycol (MIRALAX / GLYCOLAX) 17 g packet Take 17 g by mouth daily as needed for mild constipation. 03/23/20   Patricia Nettle, NP  Marilu Favre 150-35 MCG/24HR transdermal patch Place 1 patch onto the skin once a week. 02/24/20   [provider]    Allergies    Doxycycline and Flexeril [cyclobenzaprine]  Review of Systems   Review of Systems  All other systems reviewed and  are negative.   Physical Exam Updated Vital Signs BP 122/69   Pulse (!) 58   Temp 98 F (36.7 C) (Oral)   Resp (!) 22   Ht 5\' 6"  (1.676 m)   Wt 81.6 kg   SpO2 100%   BMI 29.04 kg/m   Physical Exam Vitals and nursing note reviewed.  Constitutional:      General: She is in acute distress (Uncomfortable).     Appearance: She is well-developed and well-nourished. She is not ill-appearing, toxic-appearing or diaphoretic.  HENT:     Head: Normocephalic and atraumatic.     Right Ear: External ear normal.     Left Ear: External ear normal.  Eyes:     Extraocular Movements: EOM normal.     Conjunctiva/sclera: Conjunctivae normal.     Pupils: Pupils are equal, round, and reactive to light.  Neck:     Trachea: Phonation normal.  Cardiovascular:     Rate and Rhythm: Normal rate and regular rhythm.     Heart sounds: Normal heart sounds.  Pulmonary:     Effort: Pulmonary effort is normal.     Breath sounds: Normal breath sounds.  Chest:     Chest wall: No bony tenderness.  Abdominal:     General: There is no distension.     Palpations: Abdomen is soft.     Tenderness: There is no abdominal tenderness.  Musculoskeletal:        General: Normal range of motion.     Cervical back: Normal range of motion and neck supple.     Comments: Normal strength arms and legs bilaterally.  Skin:    General: Skin is warm, dry and intact.  Neurological:     Mental Status: She is alert and oriented to person, place, and time.     Sensory: No sensory deficit.     Motor: No abnormal muscle tone.     Coordination: Coordination normal.     Comments: No dysarthria or aphasia.  Ongoing inability to look above the midline with either eye  Psychiatric:        Mood and Affect: Mood and affect and mood normal.        Behavior: Behavior normal.        Thought Content: Thought content normal.        Judgment: Judgment normal.     ED Results / Procedures / Treatments   Labs (all labs ordered are  listed, but only abnormal results are displayed) Labs Reviewed  RESP PANEL BY RT-PCR (FLU A&B, COVID) [id number]  BASIC METABOLIC PANEL  CBC WITH DIFFERENTIAL/PLATELET  I-STAT BETA HCG BLOOD, ED ([facility], [facility], [facility] ONLY)  I-STAT CHEM 8, ED    EKG EKG Interpretation  Date/Time:  Monday May 07 2020 14:39:47  EST Ventricular Rate:  66 PR Interval:    QRS Duration: 85 QT Interval:  410 QTC Calculation: 430 R Axis:   76 Text Interpretation: Sinus rhythm Probable left atrial enlargement Probable left ventricular hypertrophy since last tracing no significant change Confirmed by Daleen Bo [phone]) on 05/07/2020 3:20:36 PM   Radiology CT Head Wo Contrast  Result Date: 05/07/2020 CLINICAL DATA:  Headache, intracranial hemorrhage suspected. Additional history provided: Altered mental status, last known well 12 o'clock today, history of brain tumor status post resection 114. EXAM: CT HEAD WITHOUT CONTRAST TECHNIQUE: Contiguous axial images were obtained from the base of the skull through the vertex without intravenous contrast. COMPARISON:  Prior head CT examinations 04/29/2020 and earlier. Brain MRI 03/22/2020. FINDINGS: Brain: New from the prior head CT of 04/29/2020, there is an acute parenchymal hemorrhage centered within the right thalamus measuring 2.3 x 1.6 x 1.5 cm (series 3, image 15) (series 5, image 39). Associated mass effect with partial effacement of the third ventricle. No midline shift or ventricular entrapment. Also new from this prior exam, there is small volume acute hemorrhage within the atrium of the left lateral ventricle. Persistent small volume hyperdense hemorrhage in the expected location of the pineal gland, subtly increased from the prior study. Additionally, hyperdensity along the inferior aspect of the parietooccipital cranioplasty is new from the prior exam and small volume acute hemorrhage at this site is difficult to exclude. No demarcated cortical infarct. No extra-axial  fluid collection. No evidence of intracranial mass. Vascular: Subtle hyperdensity of the right internal cerebral vein is questioned (series 6, image 32). Skull: Bilateral frontoparietal burr holes. Redemonstrated parietooccipital cranioplasty. Sinuses/Orbits: Visualized orbits show no acute finding. Mild bilateral ethmoid, bilateral sphenoid and right maxillary sinus mucosal thickening at the imaged levels. These results were called by telephone at the time of interpretation on 05/07/2020 at 3:52 pm to provider [facility] , who verbally acknowledged these results. IMPRESSION: 2.3 x 1.6 x 1.5 cm acute parenchymal hemorrhage within the right thalamus with surrounding edema, new from the head CT of 04/29/2020. Regional mass effect with partial effacement of the third ventricle. No midline shift or ventricular entrapment. An acute infarct was present at this site on the prior brain MRI of 03/22/2020 and this could reflect hemorrhagic conversion. However, the location raises the possibility of a deep cerebral vein thrombosis and subtle hyperdensity of the right internal cerebral vein is questioned. Consider MR venography for further evaluation. Also new from the prior exam, there is small volume acute hemorrhage within the atrium of the left lateral ventricle. This may reflect intraventricular extension from the right thalamic hemorrhage. Small-volume hyperdense hemorrhage in the expected location of the pineal gland has subtly increased. Additionally, subtle hyperdensity along the inferior aspect of the parietooccipital cranioplasty is new from the prior exam and small-volume acute hemorrhage at this site is difficult to exclude. Electronically Signed   By: Kellie Simmering DO   On: 05/07/2020 16:02    Procedures .Critical Care Performed by: Daleen Bo, MD Authorized by: Daleen Bo, MD   Critical care provider statement:    Critical care time (minutes):  35   Critical care start time:  05/07/2020 2:35  PM   Critical care end time:  05/07/2020 4:29 PM   Critical care time was exclusive of:  Separately billable procedures and treating other patients   Critical care was necessary to treat or prevent imminent or life-threatening deterioration of the following conditions:  CNS failure or compromise   Critical care  was time spent personally by me on the following activities:  Blood draw for specimens, development of treatment plan with patient or surrogate, discussions with consultants, evaluation of patient's response to treatment, examination of patient, obtaining history from patient or surrogate, ordering and performing treatments and interventions, ordering and review of laboratory studies, pulse oximetry, re-evaluation of patient's condition, review of old charts and ordering and review of radiographic studies     Medications Ordered in ED Medications  ondansetron (ZOFRAN) injection 4 mg (has no administration in time range)  fentaNYL (SUBLIMAZE) injection 100 mcg (has no administration in time range)    ED Course  I have reviewed the triage vital signs and the nursing notes.  Pertinent labs & imaging results that were available during my care of the patient were reviewed by me and considered in my medical decision making (see chart for details).  Clinical Course as of 05/07/20 1633  Mon May 07, 2020  1603 I received a call from radiologist reading her head scan, which shows several areas of intracranial bleeding.  He suspects that there may be a central vein thrombosis, contributing to the multiple areas of bleeding.  Also considered are bleeding into stroke of the left thalamus. [EW]  1615 Case discussed with Dr. Christella Noa, neurosurgeon.  He recommends that the patient be managed by her care provider at [facility]. 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  Her husband is interested in having [place] order the MRV here and  proceeding with consultation, with neurosurgery at [facility]. [EW]  1626 Dr. Darl Householder was assuming care and will carry on with the current plan. [EW]    Clinical Course User Index [EW] Daleen Bo, MD   MDM Rules/Calculators/A&P                           Patient Vitals for the past 24 hrs:  BP Temp Temp src Pulse Resp SpO2 Height Weight  05/07/20 1500 122/69 -- -- (!) 58 (!) 22 100 % -- --  05/07/20 1445 (!) 146/77 -- -- (!) 54 16 99 % -- --  05/07/20 1432 -- -- -- -- -- -- 5\' 6"  (1.676 m) 81.6 kg  05/07/20 1430 (!) 145/83 98 F (36.7 C) Oral (!) 58 18 -- -- --  05/07/20 1425 -- -- -- -- -- 97 % -- --    4:30 PM Reevaluation with update and discussion. After initial assessment and treatment, an updated evaluation reveals she remains alert, and is uncomfortable.  Findings updated. Daleen Bo   Medical Decision Making:  This patient is presenting for evaluation of headache, recurrent., which does require a range of treatment options, and is a complaint that involves a moderate risk of morbidity and mortality. The differential diagnoses include nonspecific cephalization, recurrent bleeding, will want anticoagulant medication, progression of prior malignancy. I decided to review old records, and in summary middle-aged female presenting with headache, which she has had before, after her pineal surgery for cancer.  I did not require additional historical information from anyone.  Clinical Laboratory Tests Ordered, included CBC, Metabolic panel and Covid testing. Review indicates pending. Radiologic Tests Ordered, included CT head.  I independently Visualized: CT images, which show 3 areas of intracranial bleeding, mild mass-effect right thalamus area    Critical Interventions-clinical evaluation, laboratory testing, CT radiography, MRV ordered, observation reassess  After These Interventions, the Patient was  reevaluated and was found with acute intracranial bleeding, while on  anticoagulant medication.  Neurosurgery consultation in process.  Patient is acutely ill and requires specialty care and treatment.  She will likely be transferred to [facility], pending bed availability.  CRITICAL CARE-yes Performed by: Daleen Bo  Nursing Notes Reviewed/ Care Coordinated Applicable Imaging Reviewed Interpretation of Laboratory Data incorporated into ED treatment  Disposition-as per oncoming care team.    Final Clinical Impression(s) / ED Diagnoses Final diagnoses:  Intracranial bleeding ([place])  On continuous oral anticoagulation    Rx / DC Orders ED Discharge Orders    None       Daleen Bo, MD 05/07/20 [phone]

## 2020-09-17 ENCOUNTER — Emergency Department (HOSPITAL_BASED_OUTPATIENT_CLINIC_OR_DEPARTMENT_OTHER): Payer: 59

## 2020-09-17 ENCOUNTER — Emergency Department (HOSPITAL_BASED_OUTPATIENT_CLINIC_OR_DEPARTMENT_OTHER)
Admission: EM | Admit: 2020-09-17 | Discharge: 2020-09-17 | Disposition: A | Discharge status: Home / Self Care | Payer: 59 | Attending: Emergency Medicine | Admitting: Emergency Medicine

## 2020-09-17 ENCOUNTER — Other Ambulatory Visit: Payer: Self-pay

## 2020-09-17 ENCOUNTER — Encounter (HOSPITAL_BASED_OUTPATIENT_CLINIC_OR_DEPARTMENT_OTHER): Payer: Self-pay

## 2020-09-17 DIAGNOSIS — R519 Headache, unspecified: Secondary | ICD-10-CM | POA: Diagnosis not present

## 2020-09-17 DIAGNOSIS — Z86011 Personal history of benign neoplasm of the brain: Secondary | ICD-10-CM | POA: Diagnosis not present

## 2020-09-17 DIAGNOSIS — Z7901 Long term (current) use of anticoagulants: Secondary | ICD-10-CM | POA: Insufficient documentation

## 2020-09-17 LAB — CBC WITH DIFFERENTIAL/PLATELET
Abs Immature Granulocytes: 0.03 10*3/uL (ref 0.00–0.07)
Basophils Absolute: 0 10*3/uL (ref 0.0–0.1)
Basophils Relative: 1 %
Eosinophils Absolute: 0.2 10*3/uL (ref 0.0–0.5)
Eosinophils Relative: 3 %
HCT: 37.6 % (ref 36.0–46.0)
Hemoglobin: 11.4 g/dL — ABNORMAL LOW (ref 12.0–15.0)
Immature Granulocytes: 1 %
Lymphocytes Relative: 20 %
Lymphs Abs: 1.3 10*3/uL (ref 0.7–4.0)
MCH: 25.9 pg — ABNORMAL LOW (ref 26.0–34.0)
MCHC: 30.3 g/dL (ref 30.0–36.0)
MCV: 85.5 fL (ref 80.0–100.0)
Monocytes Absolute: 0.4 10*3/uL (ref 0.1–1.0)
Monocytes Relative: 6 %
Neutro Abs: 4.6 10*3/uL (ref 1.7–7.7)
Neutrophils Relative %: 69 %
Platelets: 168 10*3/uL (ref 150–400)
RBC: 4.4 MIL/uL (ref 3.87–5.11)
RDW: 18.5 % — ABNORMAL HIGH (ref 11.5–15.5)
WBC: 6.6 10*3/uL (ref 4.0–10.5)
nRBC: 0 % (ref 0.0–0.2)

## 2020-09-17 LAB — COMPREHENSIVE METABOLIC PANEL
ALT: 20 U/L (ref 0–44)
AST: 22 U/L (ref 15–41)
Albumin: 3.8 g/dL (ref 3.5–5.0)
Alkaline Phosphatase: 106 U/L (ref 38–126)
Anion gap: 8 (ref 5–15)
BUN: 10 mg/dL (ref 6–20)
CO2: 26 mmol/L (ref 22–32)
Calcium: 9.1 mg/dL (ref 8.9–10.3)
Chloride: 102 mmol/L (ref 98–111)
Creatinine, Ser: 0.72 mg/dL (ref 0.44–1.00)
GFR, Estimated: >60 mL/min (ref >60)
Glucose, Bld: 148 mg/dL — ABNORMAL HIGH (ref 70–99)
Potassium: 3.8 mmol/L (ref 3.5–5.1)
Sodium: 136 mmol/L (ref 135–145)
Total Bilirubin: 0.3 mg/dL (ref 0.3–1.2)
Total Protein: 7.4 g/dL (ref 6.5–8.1)

## 2020-09-17 LAB — PREGNANCY, URINE: Preg Test, Ur: NEGATIVE

## 2020-09-17 MED ORDER — PROCHLORPERAZINE EDISYLATE 10 MG/2ML IJ SOLN
10.0000 mg | Freq: Once | INTRAMUSCULAR | Status: AC
  Administered 2020-09-17: 10 mg via INTRAVENOUS
  Filled 2020-09-17: qty 2

## 2020-09-17 MED ORDER — DIPHENHYDRAMINE HCL 50 MG/ML IJ SOLN
25.0000 mg | Freq: Once | INTRAMUSCULAR | Status: AC
  Administered 2020-09-17: 25 mg via INTRAVENOUS
  Filled 2020-09-17: qty 1

## 2020-09-17 NOTE — Discharge Instructions (Addendum)
Shunt series and head CT unremarkable. Continue tylenol for headache. Follow up with PCP or primary doctors if ongoing symptoms. Return if symptoms worsen.

## 2020-09-17 NOTE — ED Provider Notes (Signed)
[facility] EMERGENCY DEPARTMENT Provider Note   CSN: [id number] Arrival date & time: 09/17/20  2049     History Chief Complaint  Patient presents with   Headache    Melissa Cisneros is a 40 y.o. female.  The history is provided by the patient.  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 Hx of brain mass with head bleed s/p shut and resection and radiation to brain and chemo Relieved by:  Nothing Worsened by:  Nothing Associated symptoms: no abdominal pain, no back pain, no blurred vision, no congestion, no cough, no diarrhea, no dizziness, no drainage, no ear pain, no eye pain, no facial pain, no fatigue, no fever, no focal weakness, no hearing loss, no loss of balance, no myalgias, no nausea, no near-syncope, no neck pain, no neck stiffness, no numbness, no paresthesias, no photophobia, no seizures, no sinus pressure, no sore throat, no swollen glands, no syncope, no tingling, no URI, no visual change, no vomiting and no weakness       Past Medical History:  Diagnosis Date   Anemia    Brain tumor [facility])     Patient Active Problem List   Diagnosis Date Noted   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   Intraventricular hemorrhage ([place]) 03/22/2020   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     Family History  Problem Relation Age of Onset   Hypertension Mother    Diabetes Father    Diabetes Sister    Diabetes Paternal Grandmother    Hypertension Paternal Grandmother    Migraines Neg Hx     Social History   Tobacco Use   Smoking status: Never   Smokeless tobacco: Never  Vaping Use   Vaping Use: Never used  Substance Use Topics   Alcohol use: No   Drug use: No    Home Medications Prior to Admission medications   Medication Sig Start Date End Date Taking? Authorizing Provider  acetaZOLAMIDE (DIAMOX) 250 MG tablet Take 1 tablet (250 mg total) by mouth 2 (two) times daily. Patient not taking: Reported on 05/07/2020 03/21/20   Melvenia Beam, MD  dexamethasone (DECADRON) 4 MG tablet Take 1 tablet (4 mg total) by mouth every 8 (eight) hours. Patient not taking: Reported on 05/07/2020 03/24/20   Viona Gilmore D, NP  docusate sodium (COLACE) 100 MG capsule Take 1 capsule (100 mg total) by mouth 2 (two) times daily. Patient not taking: Reported on 05/07/2020 03/24/20   Viona Gilmore D, NP  ELIQUIS 5 MG TABS tablet Take 5 mg by mouth 2 (two) times daily. 04/02/20   [provider]  gabapentin (NEURONTIN) 300 MG  capsule Take 300-600 mg by mouth See admin instructions. Taking 300mg  in the AM and 2 capsules (600mg ) in the evening. 02/27/20   [provider]  levETIRAcetam (KEPPRA) 500 MG tablet Take 1 tablet (500 mg total) by mouth 2 (two) times daily. Patient not taking: Reported on 05/07/2020 03/24/20   Viona Gilmore D, NP  memantine (NAMENDA) 10 MG tablet Take 10 mg by mouth 2 (two) times daily. 05/02/20   [provider]  ondansetron (ZOFRAN ODT) 4 MG disintegrating tablet Take 1 tablet (4 mg total) by mouth every 8 (eight) hours as needed for nausea or vomiting. 03/08/20   Truddie Hidden, MD   pantoprazole (PROTONIX) 40 MG tablet Take 1 tablet (40 mg total) by mouth daily. Patient not taking: Reported on 05/07/2020 03/24/20   Viona Gilmore D, NP  polyethylene glycol (MIRALAX / GLYCOLAX) 17 g packet Take 17 g by mouth daily as needed for mild constipation. Patient not taking: Reported on 05/07/2020 03/23/20   Viona Gilmore D, NP  promethazine (PHENERGAN) 12.5 MG tablet Take 1-2 tablets (12.5-25 mg total) by mouth every 4 (four) hours as needed for refractory nausea / vomiting. 03/23/20   Viona Gilmore D, NP    Allergies    Doxycycline and Flexeril [cyclobenzaprine]  Review of Systems   Review of Systems  Constitutional:  Negative for chills, fatigue and fever.  HENT:  Negative for congestion, ear pain, hearing loss, postnasal drip, sinus pressure and sore throat.   Eyes:  Negative for blurred vision, photophobia, pain and visual disturbance.  Respiratory:  Negative for cough and shortness of breath.   Cardiovascular:  Negative for chest pain, palpitations, syncope and near-syncope.  Gastrointestinal:  Negative for abdominal pain, diarrhea, nausea and vomiting.  Genitourinary:  Negative for dysuria and hematuria.  Musculoskeletal:  Negative for arthralgias, back pain, myalgias, neck pain and neck stiffness.  Skin:  Negative for color change and rash.  Neurological:  Positive for headaches. Negative for dizziness, focal weakness, seizures, syncope, weakness, numbness, paresthesias and loss of balance.  All other systems reviewed and are negative.  Physical Exam Updated Vital Signs BP 121/81 (BP Location: Left Arm)   Pulse 87   Temp 98.3 F (36.8 C) (Oral)   Resp 20   Ht 5\' 6"  (1.676 m)   Wt 82.1 kg   SpO2 100%   BMI 29.21 kg/m   Physical Exam Vitals and nursing note reviewed.  Constitutional:      General: She is not in acute distress.    Appearance: She is well-developed. She is not ill-appearing.  HENT:     Head: Normocephalic and atraumatic.     Mouth/Throat:      Mouth: Mucous membranes are moist.  Eyes:     General: No visual field deficit.    Extraocular Movements: Extraocular movements intact.     Right eye: Normal extraocular motion and no nystagmus.     Left eye: Normal extraocular motion and no nystagmus.     Conjunctiva/sclera: Conjunctivae normal.     Pupils: Pupils are equal, round, and reactive to light.  Cardiovascular:     Rate and Rhythm: Normal rate and regular rhythm.     Pulses: Normal pulses.     Heart sounds: Normal heart sounds. No murmur heard. Pulmonary:     Effort: Pulmonary effort is normal. No respiratory distress.     Breath sounds: Normal breath sounds.  Abdominal:     Palpations: Abdomen is soft.     Tenderness: There is  no abdominal tenderness.  Musculoskeletal:     Cervical back: Normal range of motion and neck supple.  Skin:    General: Skin is warm and dry.     Capillary Refill: Capillary refill takes less than 2 seconds.  Neurological:     General: No focal deficit present.     Mental Status: She is alert and oriented to person, place, and time.     Cranial Nerves: No cranial nerve deficit, dysarthria or facial asymmetry.     Sensory: No sensory deficit.     Motor: No weakness.     Coordination: Coordination normal.     Gait: Gait normal.  Psychiatric:        Mood and Affect: Mood normal.      ED Results / Procedures / Treatments   Labs (all labs ordered are listed, but only abnormal results are displayed) Labs Reviewed  CBC WITH DIFFERENTIAL/PLATELET - Abnormal; Notable for the following components:      Result Value   Hemoglobin 11.4 (*)    MCH 25.9 (*)    RDW 18.5 (*)    All other components within normal limits  COMPREHENSIVE METABOLIC PANEL - Abnormal; Notable for the following components:   Glucose, Bld 148 (*)    All other components within normal limits  PREGNANCY, URINE    EKG None  Radiology DG Skull 1-3 Views  Result Date: 09/17/2020 CLINICAL DATA:  Headache history of shunt  EXAM: SKULL - 1-3 VIEW; ABDOMEN - 1 VIEW; CHEST  1 VIEW COMPARISON:  CT brain 09/17/2020 FINDINGS: Skull: AP and lateral views of the skull demonstrate a right-sided shunt with tip terminating slightly to the right of midline. Shunt tubing appears contiguous. Portable chest: Right-sided shunt tubing over the right lower neck and central chest appears contiguous. Lung fields are clear. Portable abdomen: Shunt tubing with tip visualized in the left pelvis. Nonobstructed gas pattern. IVC filter overlies the right aspect of L2 and L3. IMPRESSION: Right-sided shunt tubing appears grossly intact. Electronically Signed   By: Donavan Foil M.D.   On: 09/17/2020 22:55   DG Chest 1 View  Result Date: 09/17/2020 CLINICAL DATA:  Headache history of shunt EXAM: SKULL - 1-3 VIEW; ABDOMEN - 1 VIEW; CHEST  1 VIEW COMPARISON:  CT brain 09/17/2020 FINDINGS: Skull: AP and lateral views of the skull demonstrate a right-sided shunt with tip terminating slightly to the right of midline. Shunt tubing appears contiguous. Portable chest: Right-sided shunt tubing over the right lower neck and central chest appears contiguous. Lung fields are clear. Portable abdomen: Shunt tubing with tip visualized in the left pelvis. Nonobstructed gas pattern. IVC filter overlies the right aspect of L2 and L3. IMPRESSION: Right-sided shunt tubing appears grossly intact. Electronically Signed   By: Donavan Foil M.D.   On: 09/17/2020 22:55   DG Abd 1 View  Result Date: 09/17/2020 CLINICAL DATA:  Headache history of shunt EXAM: SKULL - 1-3 VIEW; ABDOMEN - 1 VIEW; CHEST  1 VIEW COMPARISON:  CT brain 09/17/2020 FINDINGS: Skull: AP and lateral views of the skull demonstrate a right-sided shunt with tip terminating slightly to the right of midline. Shunt tubing appears contiguous. Portable chest: Right-sided shunt tubing over the right lower neck and central chest appears contiguous. Lung fields are clear. Portable abdomen: Shunt tubing with tip visualized  in the left pelvis. Nonobstructed gas pattern. IVC filter overlies the right aspect of L2 and L3. IMPRESSION: Right-sided shunt tubing appears grossly intact. Electronically Signed   By: Maudie Mercury  Francoise Ceo M.D.   On: 09/17/2020 22:55   CT Head Wo Contrast  Result Date: 09/17/2020 CLINICAL DATA:  Pain. VP shunt placed in January after surgery for brain tumor. Pt rolled over in bed last night and heard a "pop". No wiih headache. EXAM: CT HEAD WITHOUT CONTRAST TECHNIQUE: Contiguous axial images were obtained from the base of the skull through the vertex without intravenous contrast. COMPARISON:  CT head 05/08/2019 FINDINGS: Brain: Right frontal approach ventriculoperitoneal shunt with tip terminating in the region of the foramen of [place]. No evidence of large-territorial acute infarction. No parenchymal hemorrhage. No mass lesion. No extra-axial collection. No mass effect or midline shift. No hydrocephalus. Basilar cisterns are patent. Vascular: No hyperdense vessel. Skull: No acute fracture or focal lesion. Similar-appearing parieto-occipital cranioplasty. Prior left frontal burr hole. Sinuses/Orbits: Paranasal sinuses and mastoid air cells are clear. The orbits are unremarkable. Other: None. IMPRESSION: No acute intracranial abnormality. Electronically Signed   By: Iven Finn M.D.   On: 09/17/2020 21:34    Procedures Procedures   Medications Ordered in ED Medications  prochlorperazine (COMPAZINE) injection 10 mg (10 mg Intravenous Given 09/17/20 2146)  diphenhydrAMINE (BENADRYL) injection 25 mg (25 mg Intravenous Given 09/17/20 2148)    ED Course  I have reviewed the triage vital signs and the nursing notes.  Pertinent labs & imaging results that were available during my care of the patient were reviewed by me and considered in my medical decision making (see chart for details).    MDM Rules/Calculators/A&P                          Melissa Cisneros is here with headache.  History of metastatic  melanoma with metastasis to the brain.  Had mass in the brain with head bleed and now status post VP shunt.  Has had radiation to her brain as well as some immunotherapy/chemotherapy.  Has a mild headache that started this morning.  Went away for most of the day and then came back again this afternoon.  Took Tylenol with improvement.  Neurologically she is intact.  No fever.  No neck stiffness.  Given her history she wanted evaluation.  CT scan of the head shows no head bleed, no hydrocephalus.  Her exam is overall reassuring.  We will get headache cocktail, basic labs, shunt series to complete work-up.  She has no fever and have low suspicion for infectious process.  Head CT showed no head bleed, no hydrocephalus.  Shunt series shows no issues.  No significant leukocytosis, anemia, electrolyte abnormality.  No concern for infectious process.  Felt better after headache cocktail.  Recommend follow-up with primary care doctors and discharged in ED in good condition.  This chart was dictated using voice recognition software.  Despite best efforts to proofread,  errors can occur which can change the documentation meaning.  Final Clinical Impression(s) / ED Diagnoses Final diagnoses:  Bad headache    Rx / DC Orders ED Discharge Orders     None        Lennice Sites, DO 09/17/20 2301

## 2020-09-17 NOTE — ED Triage Notes (Signed)
Patient is having mild "soreness" to back of head continuing all day.  Patient has history of metastatic melanoma and a VP shunt since January

## 2020-11-11 ENCOUNTER — Emergency Department (HOSPITAL_BASED_OUTPATIENT_CLINIC_OR_DEPARTMENT_OTHER)
Admission: EM | Admit: 2020-11-11 | Discharge: 2020-11-11 | Disposition: A | Discharge status: Home / Self Care | Payer: 59 | Attending: Emergency Medicine | Admitting: Emergency Medicine

## 2020-11-11 ENCOUNTER — Encounter (HOSPITAL_BASED_OUTPATIENT_CLINIC_OR_DEPARTMENT_OTHER): Payer: Self-pay | Admitting: Urology

## 2020-11-11 ENCOUNTER — Other Ambulatory Visit: Payer: Self-pay

## 2020-11-11 DIAGNOSIS — R443 Hallucinations, unspecified: Secondary | ICD-10-CM | POA: Diagnosis not present

## 2020-11-11 DIAGNOSIS — H538 Other visual disturbances: Secondary | ICD-10-CM | POA: Diagnosis not present

## 2020-11-11 DIAGNOSIS — R531 Weakness: Secondary | ICD-10-CM | POA: Insufficient documentation

## 2020-11-11 DIAGNOSIS — R2681 Unsteadiness on feet: Secondary | ICD-10-CM | POA: Diagnosis not present

## 2020-11-11 DIAGNOSIS — Z5321 Procedure and treatment not carried out due to patient leaving prior to being seen by health care provider: Secondary | ICD-10-CM | POA: Diagnosis not present

## 2020-11-11 NOTE — ED Triage Notes (Signed)
3 day started having weakness, unsteady gait, vision changes, and hallucinations.  H/o brain tumor in January.  On immunotherapy and finished 2nd round of radiation.  H/o hemorrhagic stroke

## 2021-09-14 DEATH — deceased

## 2022-03-22 IMAGING — MR MR HEAD WO/W CM
12 of 14 series · 40 of 48 positions shown · IV contrast (Gadavist)
Comparison: BRAIN MRI 03/22/2020

CLINICAL DATA: Recurrent headaches. History of pineal gland
malignancy.

EXAM:
MR VENOGRAM HEAD WITHOUT AND WITH CONTRAST
TECHNIQUE: Angiographic images of the intracranial venous structures were
obtained using MRV technique without and with intravenous contrast.
CONTRAST:  8mL GADAVIST GADOBUTROL 1 MMOL/ML IV SOLN

[Series 10: DWI · axial · 3.0mm · 0.88mm/px · z∈[-165,-20]mm · 8 of 104 slices shown (1 of 4)]
[im 1/104]
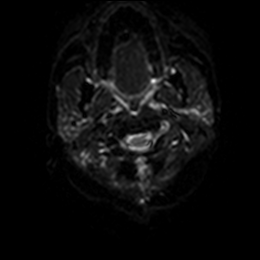
[im 15/104]
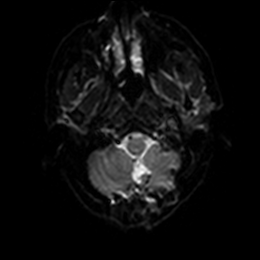
[im 30/104]
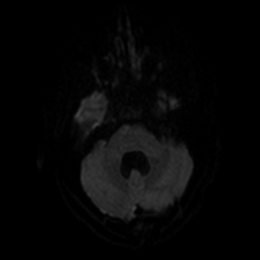
[im 45/104]
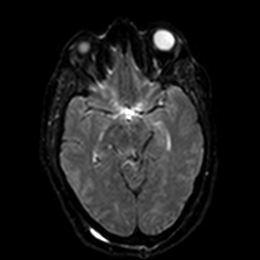
[im 59/104]
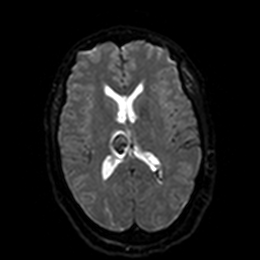
[im 74/104]
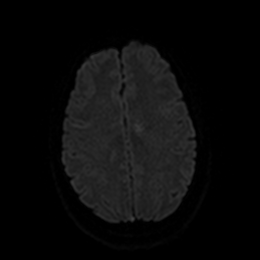
[im 89/104]
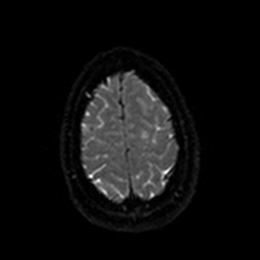
[im 104/104]
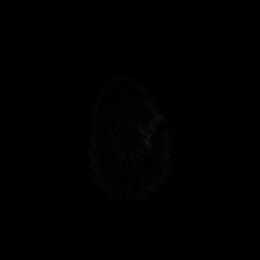

[Series 11: DWI · axial · 3.0mm · 0.88mm/px · z∈[-165,-20]mm · 4 of 52 slices shown (2 of 4)]
[im 1/52]
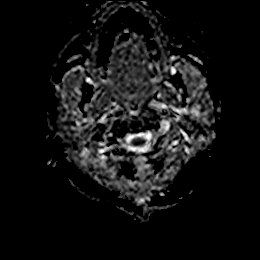
[im 18/52]
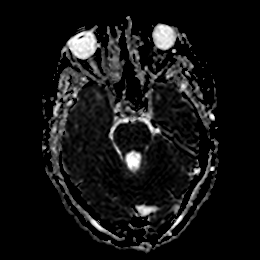
[im 35/52]
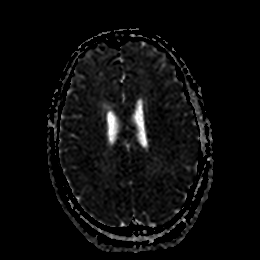
[im 52/52]
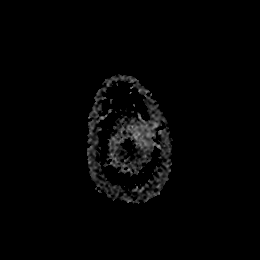

[Series 12: DWI · coronal · 4.0mm · 0.88mm/px · 5 of 72 slices shown (3 of 4)]
[im 1/72]
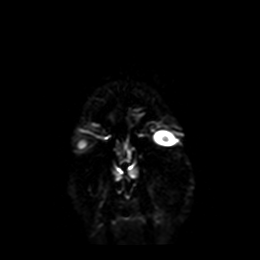
[im 18/72]
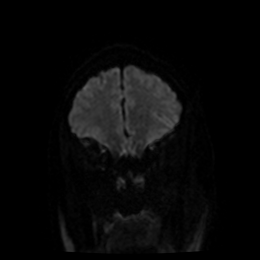
[im 36/72]
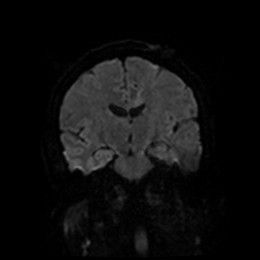
[im 54/72]
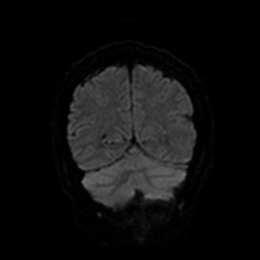
[im 72/72]
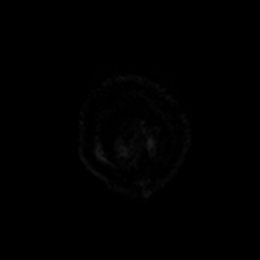

[Series 13: DWI · coronal · 4.0mm · 0.88mm/px · 3 of 36 slices shown (4 of 4)]
[im 1/36]
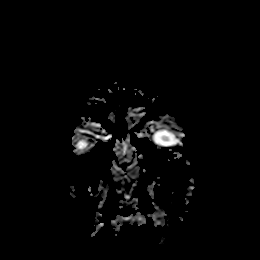
[im 18/36]
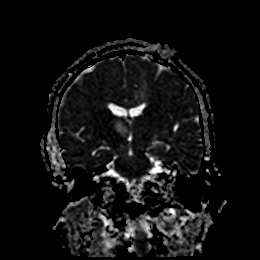
[im 36/36]
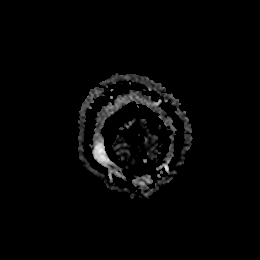

[Series 14: T1 · sagittal · 5.0mm · 0.75mm/px · 2 of 25 slices shown]
[im 1/25]
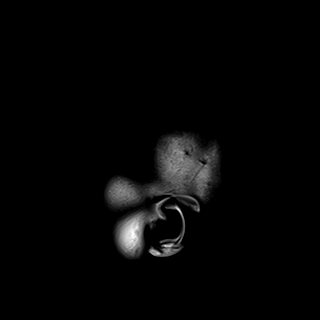
[im 25/25]
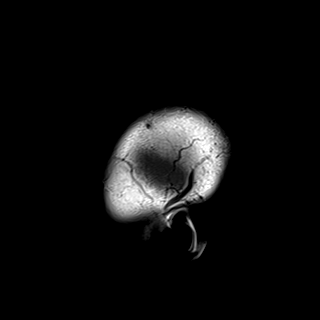

[Series 15: T2 · axial · 5.0mm · 0.72mm/px · z∈[-161,-24]mm · 2 of 25 slices shown]
[im 1/25]
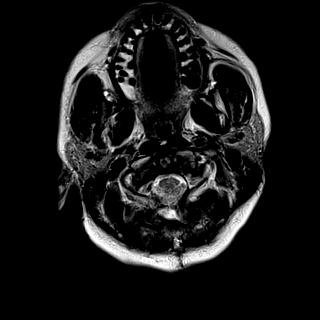
[im 25/25]
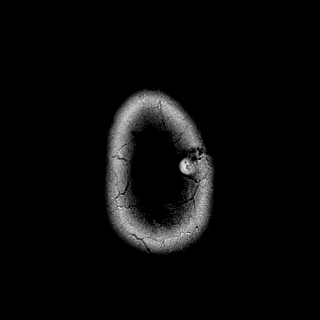

[Series 16: FLAIR · axial · 5.0mm · 0.45mm/px · z∈[-161,-24]mm · 2 of 25 slices shown]
[im 1/25]
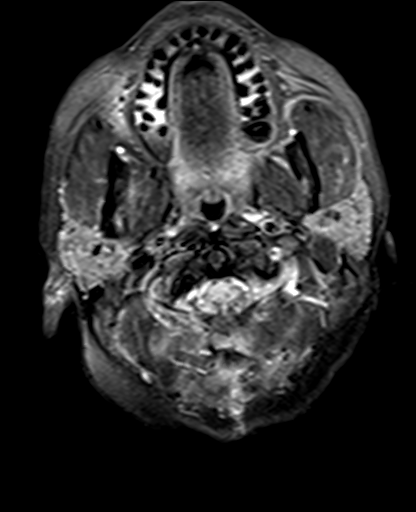
[im 25/25]
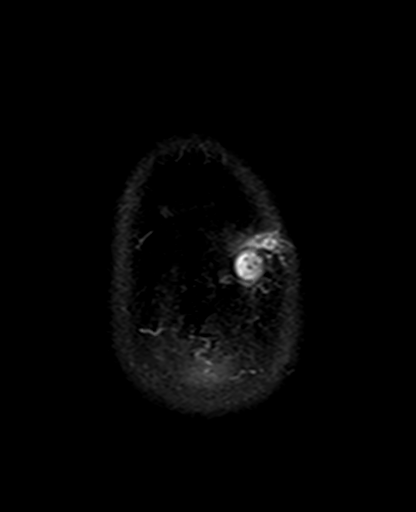

[Series 18: pha_images · axial · 3.0mm · 0.90mm/px · z∈[-166,-12]mm · 4 of 55 slices shown]
[im 1/55]
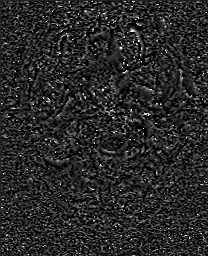
[im 19/55]
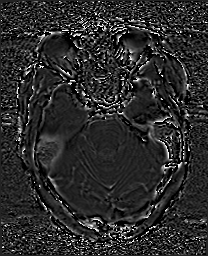
[im 37/55]
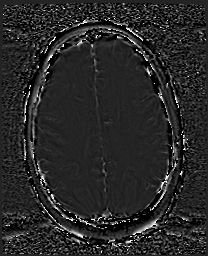
[im 55/55]
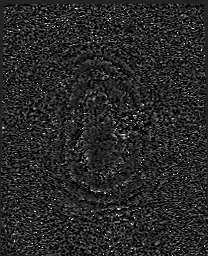

[Series 19: swi_images · axial · 3.0mm · 0.90mm/px · z∈[-166,-9]mm · 4 of 56 slices shown]
[im 1/56]
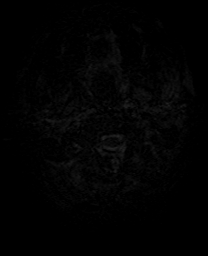
[im 19/56]
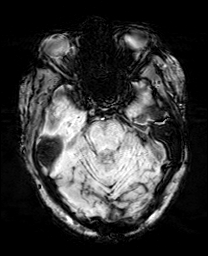
[im 37/56]
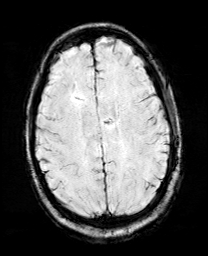
[im 56/56]
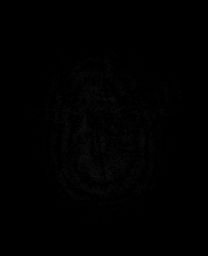

[Series 26: T2 post-contrast · coronal · 5.0mm · 0.72mm/px · 2 of 30 slices shown]
[im 1/30]
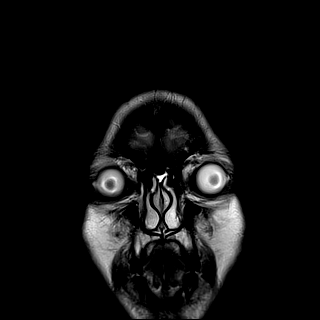
[im 30/30]
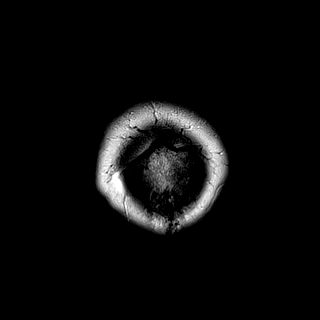

[Series 28: T1 post-contrast · coronal · 5.0mm · 0.34mm/px · 2 of 30 slices shown (1 of 2)]
[im 1/30]
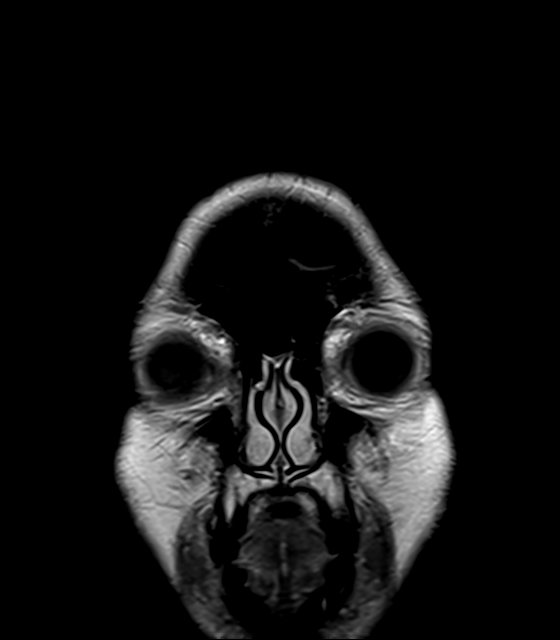
[im 30/30]
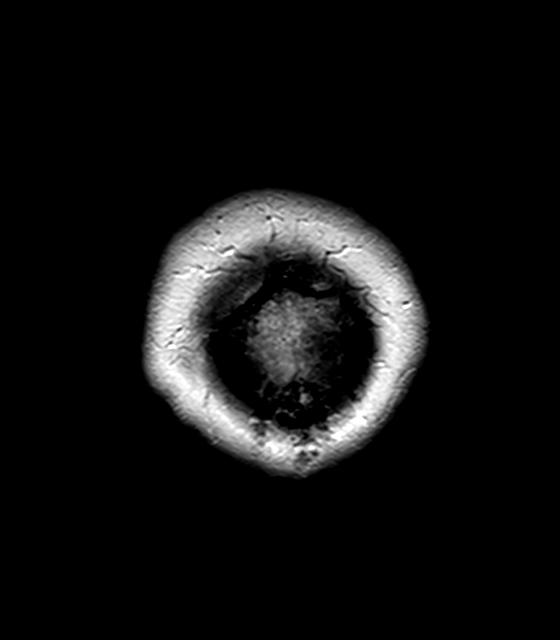

[Series 29: T1 post-contrast · sagittal · 5.0mm · 0.75mm/px · 2 of 25 slices shown (2 of 2)]
[im 1/25]
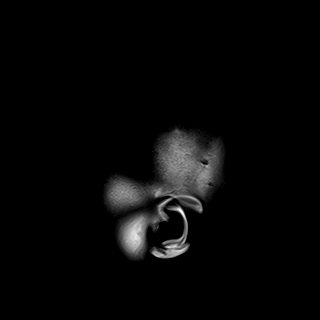
[im 25/25]
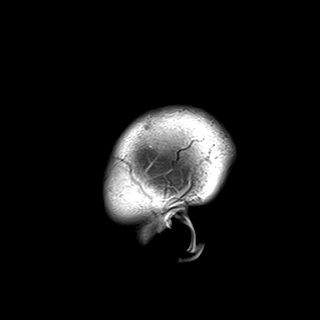

[40 of 48 positions shown; findings below may reference images not displayed]

FINDINGS: MRI BRAIN FINDINGS

Brain: There is a hemorrhagic focus is at the medial right thalamus,
unchanged compared to earlier head CT. A left parietal shunt
catheter has been placed with its tip in the left lateral ventricle.
There is a similar track in the right frontal lobe from a right
parietal approach. There is smooth contrast enhancement at the
pineal resection site. No remote contrast-enhancing lesion.

Vascular: Major flow voids are preserved.

Skull and upper cervical spine: Remote posterior craniotomy.

Sinuses/Orbits:No paranasal sinus fluid levels or advanced mucosal
thickening. No mastoid or middle ear effusion. Normal orbits.

MRV HEAD FINDINGS

Superior sagittal sinus: Normal.

Straight sinus: Normal.

Inferior sagittal sinus, vein of February and internal cerebral veins:
There is loss of flow related enhancement within the proximal
(anterior) right basal vein of [place]. The left is normal. The
internal cerebral veins are normal.

Transverse sinuses: Both transverse sinuses appear diminutive, which
may be related to prior posterior approach surgery.

Sigmoid sinuses: Normal.

Visualized jugular veins: Normal.
IMPRESSION: 1. Loss of flow related enhancement within the right basal vein of
Anjajulia Ahrends be due to occlusion or anatomic variation. No dural
venous sinus thrombosis.
2. Unchanged hemorrhagic focus at the medial right thalamus.
3. Slight contrast enhancement at the pineal resection site, likely
a postoperative appearance.
